# Patient Record
Sex: Female | Born: 1945 | Race: White | Hispanic: No | Marital: Married | Smoking: Never smoker
Health system: Southern US, Community
[De-identification: ages and names within clinical notes are randomized; demographics above are authoritative.]

## PROBLEM LIST (undated history)

## (undated) DIAGNOSIS — R011 Cardiac murmur, unspecified: Secondary | ICD-10-CM

## (undated) DIAGNOSIS — E079 Disorder of thyroid, unspecified: Secondary | ICD-10-CM

## (undated) DIAGNOSIS — M1990 Unspecified osteoarthritis, unspecified site: Secondary | ICD-10-CM

## (undated) DIAGNOSIS — K219 Gastro-esophageal reflux disease without esophagitis: Secondary | ICD-10-CM

## (undated) HISTORY — DX: Cardiac murmur, unspecified: R01.1

## (undated) HISTORY — PX: ABDOMINAL HYSTERECTOMY: SHX81

## (undated) HISTORY — DX: Disorder of thyroid, unspecified: E07.9

## (undated) HISTORY — DX: Gastro-esophageal reflux disease without esophagitis: K21.9

## (undated) HISTORY — DX: Unspecified osteoarthritis, unspecified site: M19.90

---

## 2014-03-30 DIAGNOSIS — M48061 Spinal stenosis, lumbar region without neurogenic claudication: Secondary | ICD-10-CM | POA: Insufficient documentation

## 2014-03-30 DIAGNOSIS — M7061 Trochanteric bursitis, right hip: Secondary | ICD-10-CM

## 2014-03-30 HISTORY — DX: Trochanteric bursitis, right hip: M70.61

## 2014-08-04 DIAGNOSIS — M533 Sacrococcygeal disorders, not elsewhere classified: Secondary | ICD-10-CM | POA: Insufficient documentation

## 2015-01-16 DIAGNOSIS — L812 Freckles: Secondary | ICD-10-CM | POA: Diagnosis not present

## 2015-01-16 DIAGNOSIS — R5383 Other fatigue: Secondary | ICD-10-CM | POA: Diagnosis not present

## 2015-01-16 DIAGNOSIS — E538 Deficiency of other specified B group vitamins: Secondary | ICD-10-CM | POA: Diagnosis not present

## 2015-01-16 DIAGNOSIS — D230 Other benign neoplasm of skin of lip: Secondary | ICD-10-CM | POA: Diagnosis not present

## 2015-01-16 DIAGNOSIS — D2322 Other benign neoplasm of skin of left ear and external auricular canal: Secondary | ICD-10-CM | POA: Diagnosis not present

## 2015-01-16 DIAGNOSIS — R7301 Impaired fasting glucose: Secondary | ICD-10-CM | POA: Diagnosis not present

## 2015-01-16 DIAGNOSIS — D225 Melanocytic nevi of trunk: Secondary | ICD-10-CM | POA: Diagnosis not present

## 2015-01-16 DIAGNOSIS — L218 Other seborrheic dermatitis: Secondary | ICD-10-CM | POA: Diagnosis not present

## 2015-01-16 DIAGNOSIS — R5381 Other malaise: Secondary | ICD-10-CM | POA: Diagnosis not present

## 2015-01-16 DIAGNOSIS — E012 Iodine-deficiency related (endemic) goiter, unspecified: Secondary | ICD-10-CM | POA: Diagnosis not present

## 2015-01-16 DIAGNOSIS — Z1283 Encounter for screening for malignant neoplasm of skin: Secondary | ICD-10-CM | POA: Diagnosis not present

## 2015-01-16 DIAGNOSIS — L570 Actinic keratosis: Secondary | ICD-10-CM | POA: Diagnosis not present

## 2015-01-16 DIAGNOSIS — E784 Other hyperlipidemia: Secondary | ICD-10-CM | POA: Diagnosis not present

## 2015-01-16 DIAGNOSIS — E559 Vitamin D deficiency, unspecified: Secondary | ICD-10-CM | POA: Diagnosis not present

## 2015-01-16 DIAGNOSIS — D1801 Hemangioma of skin and subcutaneous tissue: Secondary | ICD-10-CM | POA: Diagnosis not present

## 2015-01-16 DIAGNOSIS — L821 Other seborrheic keratosis: Secondary | ICD-10-CM | POA: Diagnosis not present

## 2015-01-20 DIAGNOSIS — E784 Other hyperlipidemia: Secondary | ICD-10-CM | POA: Diagnosis not present

## 2015-01-20 DIAGNOSIS — R7301 Impaired fasting glucose: Secondary | ICD-10-CM | POA: Diagnosis not present

## 2015-01-20 DIAGNOSIS — E559 Vitamin D deficiency, unspecified: Secondary | ICD-10-CM | POA: Diagnosis not present

## 2015-01-20 DIAGNOSIS — E012 Iodine-deficiency related (endemic) goiter, unspecified: Secondary | ICD-10-CM | POA: Diagnosis not present

## 2015-05-02 DIAGNOSIS — E559 Vitamin D deficiency, unspecified: Secondary | ICD-10-CM | POA: Diagnosis not present

## 2015-05-02 DIAGNOSIS — E538 Deficiency of other specified B group vitamins: Secondary | ICD-10-CM | POA: Diagnosis not present

## 2015-05-02 DIAGNOSIS — R5383 Other fatigue: Secondary | ICD-10-CM | POA: Diagnosis not present

## 2015-05-02 DIAGNOSIS — R7301 Impaired fasting glucose: Secondary | ICD-10-CM | POA: Diagnosis not present

## 2015-05-02 DIAGNOSIS — E784 Other hyperlipidemia: Secondary | ICD-10-CM | POA: Diagnosis not present

## 2015-05-02 DIAGNOSIS — T7840XA Allergy, unspecified, initial encounter: Secondary | ICD-10-CM | POA: Diagnosis not present

## 2015-05-02 DIAGNOSIS — E012 Iodine-deficiency related (endemic) goiter, unspecified: Secondary | ICD-10-CM | POA: Diagnosis not present

## 2015-05-02 DIAGNOSIS — R310 Gross hematuria: Secondary | ICD-10-CM | POA: Diagnosis not present

## 2015-05-02 DIAGNOSIS — N952 Postmenopausal atrophic vaginitis: Secondary | ICD-10-CM | POA: Diagnosis not present

## 2015-05-02 DIAGNOSIS — F430 Acute stress reaction: Secondary | ICD-10-CM | POA: Diagnosis not present

## 2015-05-02 DIAGNOSIS — R5381 Other malaise: Secondary | ICD-10-CM | POA: Diagnosis not present

## 2015-05-04 DIAGNOSIS — F430 Acute stress reaction: Secondary | ICD-10-CM | POA: Diagnosis not present

## 2015-05-04 DIAGNOSIS — E784 Other hyperlipidemia: Secondary | ICD-10-CM | POA: Diagnosis not present

## 2015-05-04 DIAGNOSIS — E039 Hypothyroidism, unspecified: Secondary | ICD-10-CM | POA: Diagnosis not present

## 2015-05-04 DIAGNOSIS — E012 Iodine-deficiency related (endemic) goiter, unspecified: Secondary | ICD-10-CM | POA: Diagnosis not present

## 2015-05-17 DIAGNOSIS — M65341 Trigger finger, right ring finger: Secondary | ICD-10-CM | POA: Diagnosis not present

## 2015-06-07 DIAGNOSIS — N951 Menopausal and female climacteric states: Secondary | ICD-10-CM | POA: Diagnosis not present

## 2015-07-06 DIAGNOSIS — E039 Hypothyroidism, unspecified: Secondary | ICD-10-CM | POA: Diagnosis not present

## 2015-07-06 DIAGNOSIS — T7840XA Allergy, unspecified, initial encounter: Secondary | ICD-10-CM | POA: Diagnosis not present

## 2015-07-06 DIAGNOSIS — E012 Iodine-deficiency related (endemic) goiter, unspecified: Secondary | ICD-10-CM | POA: Diagnosis not present

## 2015-07-06 DIAGNOSIS — E559 Vitamin D deficiency, unspecified: Secondary | ICD-10-CM | POA: Diagnosis not present

## 2015-08-09 DIAGNOSIS — M6530 Trigger finger, unspecified finger: Secondary | ICD-10-CM | POA: Diagnosis not present

## 2015-08-09 DIAGNOSIS — Z01818 Encounter for other preprocedural examination: Secondary | ICD-10-CM | POA: Diagnosis not present

## 2015-08-09 DIAGNOSIS — M65341 Trigger finger, right ring finger: Secondary | ICD-10-CM | POA: Diagnosis not present

## 2015-08-17 DIAGNOSIS — E079 Disorder of thyroid, unspecified: Secondary | ICD-10-CM | POA: Diagnosis not present

## 2015-08-17 DIAGNOSIS — M65341 Trigger finger, right ring finger: Secondary | ICD-10-CM | POA: Diagnosis not present

## 2015-12-07 DIAGNOSIS — H6121 Impacted cerumen, right ear: Secondary | ICD-10-CM | POA: Diagnosis not present

## 2015-12-07 DIAGNOSIS — H6061 Unspecified chronic otitis externa, right ear: Secondary | ICD-10-CM | POA: Diagnosis not present

## 2015-12-07 DIAGNOSIS — H903 Sensorineural hearing loss, bilateral: Secondary | ICD-10-CM | POA: Diagnosis not present

## 2016-02-14 DIAGNOSIS — E559 Vitamin D deficiency, unspecified: Secondary | ICD-10-CM | POA: Diagnosis not present

## 2016-02-14 DIAGNOSIS — E784 Other hyperlipidemia: Secondary | ICD-10-CM | POA: Diagnosis not present

## 2016-02-14 DIAGNOSIS — N952 Postmenopausal atrophic vaginitis: Secondary | ICD-10-CM | POA: Diagnosis not present

## 2016-02-14 DIAGNOSIS — E012 Iodine-deficiency related (endemic) goiter, unspecified: Secondary | ICD-10-CM | POA: Diagnosis not present

## 2016-02-14 DIAGNOSIS — R7301 Impaired fasting glucose: Secondary | ICD-10-CM | POA: Diagnosis not present

## 2016-02-26 DIAGNOSIS — N950 Postmenopausal bleeding: Secondary | ICD-10-CM | POA: Diagnosis not present

## 2016-02-26 DIAGNOSIS — E784 Other hyperlipidemia: Secondary | ICD-10-CM | POA: Diagnosis not present

## 2016-02-26 DIAGNOSIS — E012 Iodine-deficiency related (endemic) goiter, unspecified: Secondary | ICD-10-CM | POA: Diagnosis not present

## 2016-02-26 DIAGNOSIS — Z0001 Encounter for general adult medical examination with abnormal findings: Secondary | ICD-10-CM | POA: Diagnosis not present

## 2016-02-26 DIAGNOSIS — G4700 Insomnia, unspecified: Secondary | ICD-10-CM | POA: Diagnosis not present

## 2016-02-27 DIAGNOSIS — M65332 Trigger finger, left middle finger: Secondary | ICD-10-CM | POA: Diagnosis not present

## 2016-03-04 DIAGNOSIS — Z1231 Encounter for screening mammogram for malignant neoplasm of breast: Secondary | ICD-10-CM | POA: Diagnosis not present

## 2016-03-25 DIAGNOSIS — M79671 Pain in right foot: Secondary | ICD-10-CM | POA: Diagnosis not present

## 2016-08-05 DIAGNOSIS — M1812 Unilateral primary osteoarthritis of first carpometacarpal joint, left hand: Secondary | ICD-10-CM | POA: Diagnosis not present

## 2016-08-05 DIAGNOSIS — M65332 Trigger finger, left middle finger: Secondary | ICD-10-CM | POA: Diagnosis not present

## 2016-10-01 DIAGNOSIS — E559 Vitamin D deficiency, unspecified: Secondary | ICD-10-CM | POA: Diagnosis not present

## 2016-10-01 DIAGNOSIS — Z780 Asymptomatic menopausal state: Secondary | ICD-10-CM | POA: Diagnosis not present

## 2016-10-01 DIAGNOSIS — E039 Hypothyroidism, unspecified: Secondary | ICD-10-CM | POA: Diagnosis not present

## 2016-10-01 DIAGNOSIS — E7800 Pure hypercholesterolemia, unspecified: Secondary | ICD-10-CM | POA: Diagnosis not present

## 2016-10-01 DIAGNOSIS — E618 Deficiency of other specified nutrient elements: Secondary | ICD-10-CM | POA: Diagnosis not present

## 2016-11-05 DIAGNOSIS — L814 Other melanin hyperpigmentation: Secondary | ICD-10-CM | POA: Diagnosis not present

## 2016-11-05 DIAGNOSIS — L821 Other seborrheic keratosis: Secondary | ICD-10-CM | POA: Diagnosis not present

## 2016-11-05 DIAGNOSIS — D225 Melanocytic nevi of trunk: Secondary | ICD-10-CM | POA: Diagnosis not present

## 2016-11-05 DIAGNOSIS — L739 Follicular disorder, unspecified: Secondary | ICD-10-CM | POA: Diagnosis not present

## 2016-11-05 DIAGNOSIS — L820 Inflamed seborrheic keratosis: Secondary | ICD-10-CM | POA: Diagnosis not present

## 2017-01-07 DIAGNOSIS — J209 Acute bronchitis, unspecified: Secondary | ICD-10-CM | POA: Diagnosis not present

## 2017-01-20 DIAGNOSIS — Z23 Encounter for immunization: Secondary | ICD-10-CM | POA: Diagnosis not present

## 2017-02-11 DIAGNOSIS — E7800 Pure hypercholesterolemia, unspecified: Secondary | ICD-10-CM | POA: Diagnosis not present

## 2017-02-11 DIAGNOSIS — Z130 Encounter for screening for diseases of the blood and blood-forming organs and certain disorders involving the immune mechanism: Secondary | ICD-10-CM | POA: Diagnosis not present

## 2017-02-11 DIAGNOSIS — E039 Hypothyroidism, unspecified: Secondary | ICD-10-CM | POA: Diagnosis not present

## 2017-02-11 DIAGNOSIS — E618 Deficiency of other specified nutrient elements: Secondary | ICD-10-CM | POA: Diagnosis not present

## 2017-02-11 DIAGNOSIS — E559 Vitamin D deficiency, unspecified: Secondary | ICD-10-CM | POA: Diagnosis not present

## 2017-02-11 DIAGNOSIS — Z780 Asymptomatic menopausal state: Secondary | ICD-10-CM | POA: Diagnosis not present

## 2017-03-17 DIAGNOSIS — M8588 Other specified disorders of bone density and structure, other site: Secondary | ICD-10-CM | POA: Diagnosis not present

## 2017-03-17 DIAGNOSIS — Z124 Encounter for screening for malignant neoplasm of cervix: Secondary | ICD-10-CM | POA: Diagnosis not present

## 2017-03-17 DIAGNOSIS — Z1231 Encounter for screening mammogram for malignant neoplasm of breast: Secondary | ICD-10-CM | POA: Diagnosis not present

## 2017-03-17 DIAGNOSIS — Z6825 Body mass index (BMI) 25.0-25.9, adult: Secondary | ICD-10-CM | POA: Diagnosis not present

## 2017-03-17 DIAGNOSIS — N958 Other specified menopausal and perimenopausal disorders: Secondary | ICD-10-CM | POA: Diagnosis not present

## 2017-04-01 DIAGNOSIS — E618 Deficiency of other specified nutrient elements: Secondary | ICD-10-CM | POA: Diagnosis not present

## 2017-04-01 DIAGNOSIS — Z780 Asymptomatic menopausal state: Secondary | ICD-10-CM | POA: Diagnosis not present

## 2017-04-01 DIAGNOSIS — E559 Vitamin D deficiency, unspecified: Secondary | ICD-10-CM | POA: Diagnosis not present

## 2017-04-01 DIAGNOSIS — E7800 Pure hypercholesterolemia, unspecified: Secondary | ICD-10-CM | POA: Diagnosis not present

## 2017-04-01 DIAGNOSIS — E039 Hypothyroidism, unspecified: Secondary | ICD-10-CM | POA: Diagnosis not present

## 2017-07-22 DIAGNOSIS — E039 Hypothyroidism, unspecified: Secondary | ICD-10-CM | POA: Diagnosis not present

## 2017-07-22 DIAGNOSIS — E042 Nontoxic multinodular goiter: Secondary | ICD-10-CM | POA: Diagnosis not present

## 2017-07-22 DIAGNOSIS — E063 Autoimmune thyroiditis: Secondary | ICD-10-CM | POA: Diagnosis not present

## 2017-11-18 DIAGNOSIS — B002 Herpesviral gingivostomatitis and pharyngotonsillitis: Secondary | ICD-10-CM | POA: Diagnosis not present

## 2017-11-18 DIAGNOSIS — B029 Zoster without complications: Secondary | ICD-10-CM | POA: Diagnosis not present

## 2017-12-25 DIAGNOSIS — D1801 Hemangioma of skin and subcutaneous tissue: Secondary | ICD-10-CM | POA: Diagnosis not present

## 2017-12-25 DIAGNOSIS — D225 Melanocytic nevi of trunk: Secondary | ICD-10-CM | POA: Diagnosis not present

## 2017-12-25 DIAGNOSIS — L812 Freckles: Secondary | ICD-10-CM | POA: Diagnosis not present

## 2017-12-25 DIAGNOSIS — Z1389 Encounter for screening for other disorder: Secondary | ICD-10-CM | POA: Diagnosis not present

## 2017-12-25 DIAGNOSIS — L821 Other seborrheic keratosis: Secondary | ICD-10-CM | POA: Diagnosis not present

## 2017-12-25 DIAGNOSIS — L570 Actinic keratosis: Secondary | ICD-10-CM | POA: Diagnosis not present

## 2019-10-20 ENCOUNTER — Encounter (HOSPITAL_COMMUNITY): Payer: Self-pay

## 2019-10-20 ENCOUNTER — Emergency Department (HOSPITAL_BASED_OUTPATIENT_CLINIC_OR_DEPARTMENT_OTHER)
Admission: EM | Admit: 2019-10-20 | Discharge: 2019-10-21 | Disposition: A | Discharge status: Home / Self Care | Payer: Medicare Other | Attending: Emergency Medicine | Admitting: Emergency Medicine

## 2019-10-20 ENCOUNTER — Emergency Department (HOSPITAL_BASED_OUTPATIENT_CLINIC_OR_DEPARTMENT_OTHER): Payer: Medicare Other

## 2019-10-20 ENCOUNTER — Ambulatory Visit (INDEPENDENT_AMBULATORY_CARE_PROVIDER_SITE_OTHER)
Admission: EM | Admit: 2019-10-20 | Discharge: 2019-10-20 | Disposition: A | Discharge status: Home / Self Care | Payer: Medicare Other | Source: Home / Self Care | Attending: Family Medicine | Admitting: Family Medicine

## 2019-10-20 ENCOUNTER — Other Ambulatory Visit: Payer: Self-pay

## 2019-10-20 ENCOUNTER — Encounter (HOSPITAL_BASED_OUTPATIENT_CLINIC_OR_DEPARTMENT_OTHER): Payer: Self-pay

## 2019-10-20 DIAGNOSIS — R112 Nausea with vomiting, unspecified: Secondary | ICD-10-CM | POA: Insufficient documentation

## 2019-10-20 DIAGNOSIS — Z20828 Contact with and (suspected) exposure to other viral communicable diseases: Secondary | ICD-10-CM | POA: Insufficient documentation

## 2019-10-20 DIAGNOSIS — R030 Elevated blood-pressure reading, without diagnosis of hypertension: Secondary | ICD-10-CM

## 2019-10-20 DIAGNOSIS — R519 Headache, unspecified: Secondary | ICD-10-CM | POA: Insufficient documentation

## 2019-10-20 DIAGNOSIS — N289 Disorder of kidney and ureter, unspecified: Secondary | ICD-10-CM

## 2019-10-20 DIAGNOSIS — R799 Abnormal finding of blood chemistry, unspecified: Secondary | ICD-10-CM | POA: Diagnosis present

## 2019-10-20 DIAGNOSIS — Z79899 Other long term (current) drug therapy: Secondary | ICD-10-CM | POA: Insufficient documentation

## 2019-10-20 DIAGNOSIS — M546 Pain in thoracic spine: Secondary | ICD-10-CM

## 2019-10-20 LAB — CBC WITH DIFFERENTIAL/PLATELET
Abs Immature Granulocytes: 0.03 10*3/uL (ref 0.00–0.07)
Abs Immature Granulocytes: 0.04 10*3/uL (ref 0.00–0.07)
Basophils Absolute: 0 10*3/uL (ref 0.0–0.1)
Basophils Absolute: 0 10*3/uL (ref 0.0–0.1)
Basophils Relative: 0 %
Basophils Relative: 0 %
Eosinophils Absolute: 0 10*3/uL (ref 0.0–0.5)
Eosinophils Absolute: 0 10*3/uL (ref 0.0–0.5)
Eosinophils Relative: 0 %
Eosinophils Relative: 0 %
HCT: 42.5 % (ref 36.0–46.0)
HCT: 41.9 % (ref 36.0–46.0)
Hemoglobin: 14.5 g/dL (ref 12.0–15.0)
Hemoglobin: 14.1 g/dL (ref 12.0–15.0)
Immature Granulocytes: 0 %
Immature Granulocytes: 0 %
Lymphocytes Relative: 13 %
Lymphocytes Relative: 13 %
Lymphs Abs: 1.5 10*3/uL (ref 0.7–4.0)
Lymphs Abs: 1.5 10*3/uL (ref 0.7–4.0)
MCH: 32.2 pg (ref 26.0–34.0)
MCH: 32.5 pg (ref 26.0–34.0)
MCHC: 34.1 g/dL (ref 30.0–36.0)
MCHC: 33.7 g/dL (ref 30.0–36.0)
MCV: 94.4 fL (ref 80.0–100.0)
MCV: 96.5 fL (ref 80.0–100.0)
Monocytes Absolute: 1 10*3/uL (ref 0.1–1.0)
Monocytes Absolute: 1.1 10*3/uL — ABNORMAL HIGH (ref 0.1–1.0)
Monocytes Relative: 8 %
Monocytes Relative: 9 %
Neutro Abs: 9.1 10*3/uL — ABNORMAL HIGH (ref 1.7–7.7)
Neutro Abs: 9.2 10*3/uL — ABNORMAL HIGH (ref 1.7–7.7)
Neutrophils Relative %: 79 %
Neutrophils Relative %: 78 %
Platelets: 261 10*3/uL (ref 150–400)
Platelets: 245 10*3/uL (ref 150–400)
RBC: 4.5 MIL/uL (ref 3.87–5.11)
RBC: 4.34 MIL/uL (ref 3.87–5.11)
RDW: 12 % (ref 11.5–15.5)
RDW: 12.3 % (ref 11.5–15.5)
WBC: 11.7 10*3/uL — ABNORMAL HIGH (ref 4.0–10.5)
WBC: 11.9 10*3/uL — ABNORMAL HIGH (ref 4.0–10.5)
nRBC: 0 % (ref 0.0–0.2)
nRBC: 0 % (ref 0.0–0.2)

## 2019-10-20 LAB — BASIC METABOLIC PANEL
Anion gap: 9 (ref 5–15)
Anion gap: 11 (ref 5–15)
BUN: 29 mg/dL — ABNORMAL HIGH (ref 8–23)
BUN: 34 mg/dL — ABNORMAL HIGH (ref 8–23)
CO2: 23 mmol/L (ref 22–32)
CO2: 24 mmol/L (ref 22–32)
Calcium: 9.6 mg/dL (ref 8.9–10.3)
Calcium: 9.4 mg/dL (ref 8.9–10.3)
Chloride: 106 mmol/L (ref 98–111)
Chloride: 102 mmol/L (ref 98–111)
Creatinine, Ser: 3.17 mg/dL — ABNORMAL HIGH (ref 0.44–1.00)
Creatinine, Ser: 3.33 mg/dL — ABNORMAL HIGH (ref 0.44–1.00)
GFR calc Af Amer: 16 mL/min — ABNORMAL LOW (ref >60)
GFR calc Af Amer: 15 mL/min — ABNORMAL LOW (ref >60)
GFR calc non Af Amer: 14 mL/min — ABNORMAL LOW (ref >60)
GFR calc non Af Amer: 13 mL/min — ABNORMAL LOW (ref >60)
Glucose, Bld: 117 mg/dL — ABNORMAL HIGH (ref 70–99)
Glucose, Bld: 133 mg/dL — ABNORMAL HIGH (ref 70–99)
Potassium: 5 mmol/L (ref 3.5–5.1)
Potassium: 3.9 mmol/L (ref 3.5–5.1)
Sodium: 138 mmol/L (ref 135–145)
Sodium: 137 mmol/L (ref 135–145)

## 2019-10-20 LAB — POCT URINALYSIS DIP (DEVICE)
Bilirubin Urine: NEGATIVE
Glucose, UA: NEGATIVE mg/dL
Ketones, ur: NEGATIVE mg/dL
Nitrite: NEGATIVE
Protein, ur: 100 mg/dL — AB
Specific Gravity, Urine: 1.01 (ref 1.005–1.030)
Urobilinogen, UA: 0.2 mg/dL (ref 0.0–1.0)
pH: 5.5 (ref 5.0–8.0)

## 2019-10-20 MED ORDER — ONDANSETRON HCL 4 MG/2ML IJ SOLN
4.0000 mg | Freq: Once | INTRAMUSCULAR | Status: AC
  Administered 2019-10-20: 4 mg via INTRAVENOUS
  Filled 2019-10-20: qty 2

## 2019-10-20 MED ORDER — SODIUM CHLORIDE 0.9 % IV BOLUS
1000.0000 mL | Freq: Once | INTRAVENOUS | Status: AC
  Administered 2019-10-20: 1000 mL via INTRAVENOUS

## 2019-10-20 MED ORDER — ONDANSETRON 4 MG PO TBDP
ORAL_TABLET | ORAL | Status: AC
  Filled 2019-10-20: qty 1

## 2019-10-20 MED ORDER — ONDANSETRON 4 MG PO TBDP
4.0000 mg | ORAL_TABLET | Freq: Once | ORAL | Status: AC
  Administered 2019-10-20: 4 mg via ORAL

## 2019-10-20 NOTE — ED Provider Notes (Addendum)
[facility]    CSN: [id number] Arrival date & time: 10/20/19  1807      History   Chief Complaint Chief Complaint  Patient presents with  . Back Pain  . Emesis    HPI Gwendolyn Perez is a 73 y.o. female.   Nusrat Line presents with complaints of nausea. States she woke this morning at around midnight with thoracic back pain and left of back pain, she stretched it, took two tylenol and applied voltaren gel, which helped. While stretching she noted nausea. She then woke and vomited at 2, 4, and 5am today. Small amount of emesis each episode no blood or black. Has had 4-5 bm's today, not watery or loose but this is more than usual for her. She still "feels miserable." still with nausea. Some headache. Back pain has improved. No abdominal pain in particular. No urinary symptoms or blood in urine. No blood or black in stool. No specific URI symptoms. No known ill contacts. She works as a Research scientist (physical sciences) at [employer], however. States she had eaten a lot of romaine lettuce last night, no other known concerning food intake. No significant past medical history, she is otherwise healthy. No history of kidney dysfunction or htn. Has not had any fevers.     ROS per HPI, negative if not otherwise mentioned.      History reviewed. No pertinent past medical history.  There are no active problems to display for this patient.   History reviewed. No pertinent surgical history.  OB History   No obstetric history on file.      Home Medications    Prior to Admission medications   Medication Sig Start Date End Date Taking? Authorizing Provider  terbinafine (LAMISIL) 250 MG tablet Take 250 mg by mouth daily. 10/05/19  Yes [provider]  traZODone (DESYREL) 50 MG tablet Take 50 mg by mouth at bedtime. 09/27/19  Yes [provider]    Family History Family History  Problem Relation Age of Onset  . Healthy Mother   . Cancer Father     Social History Social  History   Tobacco Use  . Smoking status: Never Smoker  . Smokeless tobacco: Never Used  Substance Use Topics  . Alcohol use: Yes    Alcohol/week: 12.0 standard drinks    Types: 12 Glasses of wine per week  . Drug use: Not on file     Allergies   Patient has no known allergies.   Review of Systems Review of Systems   Physical Exam Triage Vital Signs ED Triage Vitals  Enc Vitals Group     BP 10/20/19 1828 (!) 140/104     Pulse Rate 10/20/19 1828 79     Resp 10/20/19 1828 16     Temp 10/20/19 1828 98.3 F (36.8 C)     Temp Source 10/20/19 1828 Oral     SpO2 10/20/19 1828 100 %     Weight --      Height --      Head Circumference --      Peak Flow --      Pain Score 10/20/19 1824 4     Pain Loc --      Pain Edu? --      Excl. in [place]? --    No data found.  Updated Vital Signs BP (!) 150/91 (BP Location: Right Arm)   Pulse 79   Temp 98.3 F (36.8 C) (Oral)   Resp 16   SpO2 100%  Physical Exam Constitutional:      General: She is not in acute distress.    Appearance: She is well-developed.  Cardiovascular:     Rate and Rhythm: Normal rate and regular rhythm.     Heart sounds: Normal heart sounds.  Pulmonary:     Effort: Pulmonary effort is normal.     Breath sounds: Normal breath sounds.  Abdominal:     Tenderness: There is abdominal tenderness in the left lower quadrant. There is no right CVA tenderness or left CVA tenderness.     Comments: Mild llq pain on palpation   Musculoskeletal:     Comments: Mild proximal thoracic tenderness, patient states history of arthritis and this is not necessarily new for her.   Skin:    General: Skin is warm and dry.  Neurological:     Mental Status: She is alert and oriented to person, place, and time.    EKG:  NSR rate of 80 . Previous EKG was not available for review. No stwave changes as interpreted by me.    UC Treatments / Results  Labs (all labs ordered are listed, but only abnormal results are displayed)  Labs Reviewed  CBC WITH DIFFERENTIAL/PLATELET - Abnormal; Notable for the following components:      Result Value   WBC 11.7 (*)    Neutro Abs 9.1 (*)    All other components within normal limits  BASIC METABOLIC PANEL - Abnormal; Notable for the following components:   Glucose, Bld 117 (*)    BUN 29 (*)    Creatinine, Ser 3.17 (*)    GFR calc non Af Amer 14 (*)    GFR calc Af Amer 16 (*)    All other components within normal limits  POCT URINALYSIS DIP (DEVICE) - Abnormal; Notable for the following components:   Hgb urine dipstick SMALL (*)    Protein, ur 100 (*)    Leukocytes,Ua TRACE (*)    All other components within normal limits  SARS CORONAVIRUS 2 (TAT 6-24 HRS)  URINE CULTURE    EKG   Radiology No results found.  Procedures Procedures (including critical care time)  Medications Ordered in UC Medications  ondansetron (ZOFRAN-ODT) disintegrating tablet 4 mg (4 mg Oral Given 10/20/19 1841)  ondansetron (ZOFRAN-ODT) 4 MG disintegrating tablet (has no administration in time range)    Initial Impression / Assessment and Plan / UC Course  I have reviewed the triage vital signs and the nursing notes.  Pertinent labs & imaging results that were available during my care of the patient were reviewed by me and considered in my medical decision making (see chart for details).     Patient is tolerating oral sips in clinic now. Back pain has improved. Hypertensive here today. No fever. No tachycardia. Non specific physical exam at this time. No emesis in approximately 12 hours. BMP presents with elevated creatinine of 3.1 which is new for patient, per chart review in care everywhere, and to patient's knowledge. WBC and neutrophils also elevated. Acute renal insuffiency, I do not feel I have suffiecient evidence for definitive diagnosis here in uc. Kidney stone vs gastroenteritis with dehydration vs diverticulitis vs covid-19 all considered. Recommended further evaluation and  management in the ER at this time. Patient verbalized understanding and agreeable to plan.  Safe for self transport.  Final Clinical Impressions(s) / UC Diagnoses   Final diagnoses:  Acute renal insufficiency  Acute left-sided thoracic back pain  Non-intractable vomiting with nausea, unspecified vomiting type  Discharge Instructions     I do recommend further evaluation in the emergency department as it does appear that your kidneys are acutely insufficient.     ED Prescriptions    None     PDMP not reviewed this encounter.   Zigmund Gottron, NP 10/20/19 1958    Zigmund Gottron, NP 10/20/19 2007

## 2019-10-20 NOTE — Discharge Instructions (Addendum)
I do recommend further evaluation in the emergency department as it does appear that your kidneys are acutely insufficient.

## 2019-10-20 NOTE — ED Triage Notes (Signed)
Patient presents to Urgent Care with complaints of back pain followed by three episodes of vomiting hours later since earlier today. Patient reports she has continued to feel nauseous but has not vomited in the past 2 hours.

## 2019-10-20 NOTE — ED Provider Notes (Signed)
[facility] EMERGENCY DEPARTMENT Provider Note   CSN: [id number] Arrival date & time: 10/20/19  2043     History   Chief Complaint Chief Complaint  Patient presents with   Abnormal Lab    HPI Gwendolyn Perez is a 74 y.o. female.     The history is provided by the patient.  Emesis Severity:  Mild Duration:  1 day Timing:  Rare Number of daily episodes:  1 Quality:  Stomach contents Able to tolerate:  Liquids and solids Progression:  Resolved Chronicity:  New Recent urination:  Normal Context: not post-tussive   Relieved by:  Nothing Worsened by:  Nothing Ineffective treatments:  None tried Associated symptoms: no abdominal pain, no arthralgias, no chills, no cough, no diarrhea, no fever, no headaches, no myalgias, no sore throat and no URI   Risk factors: no alcohol use   Patient with no significant PMH was seen at urgent care for one episode of emesis and stooling x 4 in one day but it was not diarrhea.  She had some back pain with this.  No f/c/r.  She is not having dysuria nor hematuria.  No f/c/r. No covid contacts nor symptoms.  She was found to have an elevated creatinine and was referred in for further work up of same.  Given zofran.  No further emesis or stooling.    History reviewed. No pertinent past medical history.  There are no active problems to display for this patient.   History reviewed. No pertinent surgical history.   OB History   No obstetric history on file.      Home Medications    Prior to Admission medications   Medication Sig Start Date End Date Taking? Authorizing Provider  terbinafine (LAMISIL) 250 MG tablet Take 250 mg by mouth daily. 10/05/19   [provider]  traZODone (DESYREL) 50 MG tablet Take 50 mg by mouth at bedtime. 09/27/19   [provider]    Family History Family History  Problem Relation Age of Onset   Healthy Mother    Cancer Father     Social History Social History   Tobacco  Use   Smoking status: Never Smoker   Smokeless tobacco: Never Used  Substance Use Topics   Alcohol use: Yes    Alcohol/week: 12.0 standard drinks    Types: 12 Glasses of wine per week   Drug use: Not on file     Allergies   Patient has no known allergies.   Review of Systems Review of Systems  Constitutional: Negative for chills and fever.  HENT: Negative for sore throat.   Respiratory: Negative for cough and shortness of breath.   Cardiovascular: Negative for chest pain.  Gastrointestinal: Positive for nausea and vomiting. Negative for abdominal pain and diarrhea.  Genitourinary: Negative for difficulty urinating and genital sores.  Musculoskeletal: Negative for arthralgias and myalgias.  Skin: Negative for color change.  Neurological: Negative for headaches.  Psychiatric/Behavioral: Negative for agitation.  All other systems reviewed and are negative.    Physical Exam Updated Vital Signs BP (!) 141/85 (BP Location: Left Arm)    Pulse 85    Temp 98.8 F (37.1 C) (Oral)    Resp 18    Ht 5\' 3"  (1.6 m)    Wt 65.8 kg    SpO2 96%    BMI 25.69 kg/m   Physical Exam Vitals signs and nursing note reviewed.  Constitutional:      General: She is not in acute distress.  Appearance: Normal appearance.  HENT:     Head: Normocephalic and atraumatic.     Nose: Nose normal.  Eyes:     Conjunctiva/sclera: Conjunctivae normal.     Pupils: Pupils are equal, round, and reactive to light.  Neck:     Musculoskeletal: Normal range of motion and neck supple.  Cardiovascular:     Rate and Rhythm: Normal rate and regular rhythm.     Pulses: Normal pulses.     Heart sounds: Normal heart sounds.  Pulmonary:     Effort: Pulmonary effort is normal.     Breath sounds: Normal breath sounds.  Abdominal:     General: Abdomen is flat. Bowel sounds are normal.     Tenderness: There is no abdominal tenderness. There is no guarding or rebound.  Musculoskeletal: Normal range of motion.    Skin:    General: Skin is warm and dry.     Capillary Refill: Capillary refill takes less than 2 seconds.  Neurological:     General: No focal deficit present.     Mental Status: She is alert and oriented to person, place, and time.     Deep Tendon Reflexes: Reflexes normal.  Psychiatric:        Mood and Affect: Mood normal.        Behavior: Behavior normal.      ED Treatments / Results  Labs (all labs ordered are listed, but only abnormal results are displayed) Results for orders placed or performed during the hospital encounter of [id number]  Basic metabolic panel  Result Value Ref Range   Sodium 137 135 - 145 mmol/L   Potassium 3.9 3.5 - 5.1 mmol/L   Chloride 102 98 - 111 mmol/L   CO2 24 22 - 32 mmol/L   Glucose, Bld 133 (H) 70 - 99 mg/dL   BUN 34 (H) 8 - 23 mg/dL   Creatinine, Ser 3.33 (H) 0.44 - 1.00 mg/dL   Calcium 9.4 8.9 - 10.3 mg/dL   GFR calc non Af Amer 13 (L) >60 mL/min   GFR calc Af Amer 15 (L) >60 mL/min   Anion gap 11 5 - 15  CBC with Differential  Result Value Ref Range   WBC 11.9 (H) 4.0 - 10.5 K/uL   RBC 4.34 3.87 - 5.11 MIL/uL   Hemoglobin 14.1 12.0 - 15.0 g/dL   HCT 41.9 36.0 - 46.0 %   MCV 96.5 80.0 - 100.0 fL   MCH 32.5 26.0 - 34.0 pg   MCHC 33.7 30.0 - 36.0 g/dL   RDW 12.3 11.5 - 15.5 %   Platelets 245 150 - 400 K/uL   nRBC 0.0 0.0 - 0.2 %   Neutrophils Relative % 78 %   Neutro Abs 9.2 (H) 1.7 - 7.7 K/uL   Lymphocytes Relative 13 %   Lymphs Abs 1.5 0.7 - 4.0 K/uL   Monocytes Relative 9 %   Monocytes Absolute 1.1 (H) 0.1 - 1.0 K/uL   Eosinophils Relative 0 %   Eosinophils Absolute 0.0 0.0 - 0.5 K/uL   Basophils Relative 0 %   Basophils Absolute 0.0 0.0 - 0.1 K/uL   Immature Granulocytes 0 %   Abs Immature Granulocytes 0.04 0.00 - 0.07 K/uL  Urinalysis, Routine w reflex microscopic  Result Value Ref Range   Color, Urine YELLOW YELLOW   APPearance CLEAR CLEAR   Specific Gravity, Urine <1.005 (L) 1.005 - 1.030   pH 6.0 5.0 - 8.0    Glucose, UA NEGATIVE NEGATIVE mg/dL  Hgb urine dipstick SMALL (A) NEGATIVE   Bilirubin Urine NEGATIVE NEGATIVE   Ketones, ur NEGATIVE NEGATIVE mg/dL   Protein, ur 30 (A) NEGATIVE mg/dL   Nitrite NEGATIVE NEGATIVE   Leukocytes,Ua SMALL (A) NEGATIVE  Urinalysis, Microscopic (reflex)  Result Value Ref Range   RBC / HPF 6-10 0 - 5 RBC/hpf   WBC, UA 6-10 0 - 5 WBC/hpf   Bacteria, UA RARE (A) NONE SEEN   Squamous Epithelial / LPF 0-5 0 - 5   Ct Renal Stone Study  Result Date: 10/20/2019 CLINICAL DATA:  Abdominal pain and vomiting EXAM: CT ABDOMEN AND PELVIS WITHOUT CONTRAST TECHNIQUE: Multidetector CT imaging of the abdomen and pelvis was performed following the standard protocol without IV contrast. COMPARISON:  None. FINDINGS: Lower chest: No acute abnormality. Hepatobiliary: No focal liver abnormality is seen. No gallstones, gallbladder wall thickening, or biliary dilatation. Pancreas: Unremarkable. No pancreatic ductal dilatation or surrounding inflammatory changes. Spleen: Multiple splenic granulomas are seen. Adrenals/Urinary Tract: Adrenal glands are within normal limits. The right kidney demonstrates no renal calculi or obstructive changes. The ureter is within normal limits. The bladder is decompressed. The left kidney demonstrates mild perinephric stranding. No renal calculi or obstructive changes are noted. Stomach/Bowel: Scattered diverticular change of the colon is noted without evidence of diverticulitis. No obstructive changes of the colon are seen. The appendix is within normal limits. No small bowel or gastric abnormality is seen. Vascular/Lymphatic: Aortic atherosclerosis. No enlarged abdominal or pelvic lymph nodes. Reproductive: Uterus and bilateral adnexa are unremarkable. Other: No abdominal wall hernia or abnormality. No abdominopelvic ascites. Musculoskeletal: Degenerative changes of the lumbar spine are noted. No acute bony abnormality is seen. IMPRESSION: Mild perinephric  stranding of the left kidney without obstructive change. This could be related to underlying UTI. Diverticulosis without diverticulitis. No other focal abnormality is noted. Electronically Signed   By: Inez Catalina M.D.   On: 10/20/2019 23:42    Radiology No results found.  Procedures Procedures (including critical care time)  Medications Ordered in ED Medications  sodium chloride 0.9 % bolus 1,000 mL (has no administration in time range)  ondansetron (ZOFRAN) injection 4 mg (has no administration in time range)     Initial Impression / Assessment and Plan / ED Course  Patient sent here due to elevation of creatinine.  Based on BUN/Cr ratio and only one episode of emesis this is not dehydration. It is not obstructive as there are no stones or bladder outlet obstruction.  I am unable to find an offending medication or cause of this on history that I can stop to improve the elevation of creatinine.  I have hydrated the patient copiously in the ED though I do not believe this will completely relieve the elevation.  I will start antibiotics given subtle stranding seen on ct scan. I am not sure based on urine and CT this will bring creatinine back to normal either.  I will refer patient back to her PMD for labs in 5 days.  She is to hydrate copiously with water at home.  No NSAIDS.  Take all antibiotics.  I have also referred the patient to [facility] for further work up. I have had a very long discussion with the patient and her husband regarding her work up and my concerns.  I do not believe this is all dehydration.  Nor do I think a mild infection, possibly based on CT nothing on urine, would cause this.  Patient and husband verbalize understanding and agree to  follow up.    Gwendolyn Perez was evaluated in Emergency Department on 10/21/2019 for the symptoms described in the history of present illness. She was evaluated in the context of the global COVID-19 pandemic, which necessitated consideration  that the patient might be at risk for infection with the SARS-CoV-2 virus that causes COVID-19. Institutional protocols and algorithms that pertain to the evaluation of patients at risk for COVID-19 are in a state of rapid change based on information released by regulatory bodies including the CDC and federal and state organizations. These policies and algorithms were followed during the patient's care in the ED.  Final Clinical Impressions(s) / ED Diagnoses    Return for intractable cough, coughing up blood,fevers >100.4 unrelieved by medication, shortness of breath, intractable vomiting, chest pain, shortness of breath, weakness,numbness, changes in speech, facial asymmetry,abdominal pain, passing out,Inability to tolerate liquids or food, cough, altered mental status or any concerns. No signs of systemic illness or infection. The patient is nontoxic-appearing on exam and vital signs are within normal limits.   I have reviewed the triage vital signs and the nursing notes. Pertinent labs &imaging results that were available during my care of the patient were reviewed by me and considered in my medical decision making (see chart for details).  After history, exam, and medical workup I feel the patient has been appropriately medically screened and is safe for discharge home. Pertinent diagnoses were discussed with the patient. Patient was given return precautions   Jakobee Brackins, MD 10/21/19 0200

## 2019-10-20 NOTE — ED Triage Notes (Signed)
Pt c/o left flank/mid back pain, n/v started last night-seen and sent by PCP states she was sent for "kidney malfunction"-pt states her n/v was treated with med by PCP and her n/v is better-pt NAD-steady gait

## 2019-10-21 ENCOUNTER — Encounter (HOSPITAL_BASED_OUTPATIENT_CLINIC_OR_DEPARTMENT_OTHER): Payer: Self-pay | Admitting: Emergency Medicine

## 2019-10-21 LAB — URINALYSIS, ROUTINE W REFLEX MICROSCOPIC
Bilirubin Urine: NEGATIVE
Glucose, UA: NEGATIVE mg/dL
Ketones, ur: NEGATIVE mg/dL
Nitrite: NEGATIVE
Protein, ur: 30 mg/dL — AB
Specific Gravity, Urine: <1.005 — ABNORMAL LOW (ref 1.005–1.030)
pH: 6 (ref 5.0–8.0)

## 2019-10-21 LAB — URINALYSIS, MICROSCOPIC (REFLEX)

## 2019-10-21 MED ORDER — SODIUM CHLORIDE 0.9 % IV BOLUS
500.0000 mL | Freq: Once | INTRAVENOUS | Status: AC
  Administered 2019-10-21: 500 mL via INTRAVENOUS

## 2019-10-21 MED ORDER — SULFAMETHOXAZOLE-TRIMETHOPRIM 800-160 MG PO TABS: 1.0000 | ORAL_TABLET | Freq: Two times a day (BID) | ORAL | 0 refills | Status: AC

## 2019-10-21 NOTE — Discharge Instructions (Addendum)
Drink copious water.  Take all antibiotics.  Follow up with your doctor and [facility]

## 2019-10-22 LAB — NOVEL CORONAVIRUS, NAA (HOSP ORDER, SEND-OUT TO REF LAB; TAT 18-24 HRS): SARS-CoV-2, NAA: NOT DETECTED

## 2019-10-22 LAB — URINE CULTURE

## 2020-05-24 ENCOUNTER — Ambulatory Visit: Payer: Medicare Other | Admitting: Family Medicine

## 2020-08-17 ENCOUNTER — Ambulatory Visit: Payer: Medicare Other | Admitting: Podiatry

## 2020-08-22 ENCOUNTER — Ambulatory Visit (INDEPENDENT_AMBULATORY_CARE_PROVIDER_SITE_OTHER): Payer: Medicare Other

## 2020-08-22 ENCOUNTER — Encounter: Payer: Self-pay | Admitting: Podiatry

## 2020-08-22 ENCOUNTER — Other Ambulatory Visit: Payer: Self-pay

## 2020-08-22 ENCOUNTER — Ambulatory Visit (INDEPENDENT_AMBULATORY_CARE_PROVIDER_SITE_OTHER): Payer: Medicare Other | Admitting: Podiatry

## 2020-08-22 DIAGNOSIS — M2041 Other hammer toe(s) (acquired), right foot: Secondary | ICD-10-CM | POA: Diagnosis not present

## 2020-08-22 DIAGNOSIS — M25559 Pain in unspecified hip: Secondary | ICD-10-CM | POA: Insufficient documentation

## 2020-08-22 DIAGNOSIS — M25649 Stiffness of unspecified hand, not elsewhere classified: Secondary | ICD-10-CM | POA: Insufficient documentation

## 2020-08-22 DIAGNOSIS — M6281 Muscle weakness (generalized): Secondary | ICD-10-CM | POA: Insufficient documentation

## 2020-08-22 DIAGNOSIS — L603 Nail dystrophy: Secondary | ICD-10-CM | POA: Diagnosis not present

## 2020-08-22 DIAGNOSIS — M25549 Pain in joints of unspecified hand: Secondary | ICD-10-CM | POA: Insufficient documentation

## 2020-08-22 DIAGNOSIS — Q828 Other specified congenital malformations of skin: Secondary | ICD-10-CM

## 2020-08-22 DIAGNOSIS — M19049 Primary osteoarthritis, unspecified hand: Secondary | ICD-10-CM | POA: Insufficient documentation

## 2020-08-22 DIAGNOSIS — M6530 Trigger finger, unspecified finger: Secondary | ICD-10-CM | POA: Insufficient documentation

## 2020-08-22 DIAGNOSIS — R269 Unspecified abnormalities of gait and mobility: Secondary | ICD-10-CM | POA: Insufficient documentation

## 2020-08-23 NOTE — Progress Notes (Signed)
  Subjective:  Patient ID: De Burrs, female    DOB: 01-23-1946,  MRN: [id number] HPI Chief Complaint  Patient presents with  . Toe Pain    2nd toe (medial) right - callused area x several months, tried acid remover-no help, gets tender  . Nail Problem    3rd toenail left - thick and discolored x 4-5 months, took oral meds for fungus years ago for another toenail and it cleared up  . Leg Problem    Questions regarding limb length difference and how to correct  . New Patient (Initial Visit)    74 y.o. female presents with the above complaint.   ROS: Denies fever chills nausea vomiting muscle aches pains calf pain back pain chest pain shortness of breath.  No past medical history on file. No past surgical history on file. No current outpatient medications on file.  Allergies  Allergen Reactions  . Black Cohosh   . Sulfa Antibiotics    Review of Systems Objective:  There were no vitals filed for this visit.  General: Well developed, nourished, in no acute distress, alert and oriented x3   Dermatological: Skin is warm, dry and supple bilateral. Nails x 10 are well maintained; remaining integument appears unremarkable at this time. There are no open sores, no preulcerative lesions, no rash or signs of infection present.  Toenail third digit left foot demonstrates either nail dystrophy or dermatophytic infection.  Vascular: Dorsalis Pedis artery and Posterior Tibial artery pedal pulses are 2/4 bilateral with immedate capillary fill time. Pedal hair growth present. No varicosities and no lower extremity edema present bilateral.   Neruologic: Grossly intact via light touch bilateral. Vibratory intact via tuning fork bilateral. Protective threshold with Semmes Wienstein monofilament intact to all pedal sites bilateral. Patellar and Achilles deep tendon reflexes 2+ bilateral. No Babinski or clonus noted bilateral.   Musculoskeletal: No gross boney pedal deformities bilateral. No pain,  crepitus, or limitation noted with foot and ankle range of motion bilateral. Muscular strength 5/5 in all groups tested bilateral.  Mild bunion deformity resulting and a juxtaposition at the level of the DIPJ with irritation of that second toe.  She also has a slight leg length discrepancy the left leg appears to be about 1/4 inch shorter than the left  Gait: Unassisted, Nonantalgic.    Radiographs:  Radiographs taken today demonstrate second toe of the right foot does have a lateral deviation to it secondary to a mild bunion deformity.  Otherwise no acute findings are noted.  Assessment & Plan:   Assessment: Short left leg.  Nail dystrophy third left.  Plan: Discussed etiology pathology and surgical therapies at this point in time encouraged her to try and eighth inch heel lift left and if this works for her we can have a pair of orthotics made for her.  Also took samples of the nail today to be sent for pathologic evaluation provided her with a silicone digital sleeve for the second toe of the right foot.     Leotta Weingarten T. [place], [place]

## 2020-09-19 ENCOUNTER — Ambulatory Visit: Payer: Medicare Other | Admitting: Podiatry

## 2020-10-10 ENCOUNTER — Encounter: Payer: Self-pay | Admitting: Podiatry

## 2020-10-10 ENCOUNTER — Other Ambulatory Visit: Payer: Self-pay

## 2020-10-10 ENCOUNTER — Ambulatory Visit: Payer: Medicare Other | Admitting: Podiatry

## 2020-10-10 DIAGNOSIS — L603 Nail dystrophy: Secondary | ICD-10-CM

## 2020-10-10 DIAGNOSIS — M2170 Unequal limb length (acquired), unspecified site: Secondary | ICD-10-CM

## 2020-10-10 DIAGNOSIS — Z79899 Other long term (current) drug therapy: Secondary | ICD-10-CM

## 2020-10-10 MED ORDER — TERBINAFINE HCL 250 MG PO TABS: 250.0000 mg | ORAL_TABLET | Freq: Every day | ORAL | 0 refills | Status: DC

## 2020-10-10 NOTE — Progress Notes (Signed)
She presents today states that the lift that she has been using in the right shoe is doing much better she states that she feels a very subtle change in her lower back and her sciatic nerve.  She is also here for her pathology results on her toenails.  Objective: Vital signs are stable alert oriented x3.  Pulses are palpable.  No change in physical exam.  Pathology results do demonstrate dermatophytes for onychomycosis.  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  And onychomycosis.  Plan: Discussed etiology pathology and surgical therapies at this point time went ahead and started her on oral therapy we did discuss topical and laser.  We discussed the pros and cons of use of this oral therapy as well as the necessity for blood work she understands and is amendable to it.  We discussed the side effects and possible allergic reactions.  Dispensed Lamisil 30 and #1 p.o. daily we will follow-up with her in 1 month and she will f will see Liliane Channel today for orthotics.

## 2020-10-11 LAB — COMPREHENSIVE METABOLIC PANEL
AG Ratio: 1.9 (calc) (ref 1.0–2.5)
ALT: 20 U/L (ref 6–29)
AST: 17 U/L (ref 10–35)
Albumin: 4.4 g/dL (ref 3.6–5.1)
Alkaline phosphatase (APISO): 102 U/L (ref 37–153)
BUN: 16 mg/dL (ref 7–25)
CO2: 27 mmol/L (ref 20–32)
Calcium: 10 mg/dL (ref 8.6–10.4)
Chloride: 100 mmol/L (ref 98–110)
Creat: 0.87 mg/dL (ref 0.60–0.93)
Globulin: 2.3 g/dL (calc) (ref 1.9–3.7)
Glucose, Bld: 84 mg/dL (ref 65–139)
Potassium: 4.2 mmol/L (ref 3.5–5.3)
Sodium: 136 mmol/L (ref 135–146)
Total Bilirubin: 0.6 mg/dL (ref 0.2–1.2)
Total Protein: 6.7 g/dL (ref 6.1–8.1)

## 2020-10-16 ENCOUNTER — Telehealth: Payer: Self-pay | Admitting: Orthotics

## 2020-11-07 ENCOUNTER — Telehealth: Payer: Self-pay | Admitting: Podiatry

## 2020-11-07 NOTE — Telephone Encounter (Signed)
Patient called in requesting order for new blood test, please advise

## 2020-11-07 NOTE — Telephone Encounter (Signed)
Can you print out the requisition from her last visit and she can pick this up.

## 2020-11-16 ENCOUNTER — Encounter: Payer: Self-pay | Admitting: Podiatry

## 2020-11-16 ENCOUNTER — Ambulatory Visit: Payer: Medicare Other | Admitting: Podiatry

## 2020-11-16 ENCOUNTER — Other Ambulatory Visit: Payer: Medicare Other | Admitting: Orthotics

## 2020-11-16 ENCOUNTER — Other Ambulatory Visit: Payer: Self-pay

## 2020-11-16 DIAGNOSIS — Z79899 Other long term (current) drug therapy: Secondary | ICD-10-CM

## 2020-11-16 MED ORDER — TERBINAFINE HCL 250 MG PO TABS: 250.0000 mg | ORAL_TABLET | Freq: Every day | ORAL | 0 refills | Status: DC

## 2020-11-16 NOTE — Progress Notes (Signed)
She presents today for follow-up of her Lamisil.  She denies fever chills nausea vomiting muscle aches pains calf pain back pain chest pain shortness of breath.  Objective: Vital signs are stable alert oriented x3 appears that the fourth toenail is growing out very nicely at this point.  Assessment: Well-healing onychomycosis with long-term use of Lamisil.  Plan: At this point requesting a liver profile in another 90 days of Lamisil.  Follow-up with her in 4 months.  We will notify her as to the results of her liver profile.

## 2020-11-22 NOTE — Telephone Encounter (Signed)
Ca

## 2021-01-05 DIAGNOSIS — Z20822 Contact with and (suspected) exposure to covid-19: Secondary | ICD-10-CM | POA: Diagnosis not present

## 2021-01-10 DIAGNOSIS — Z1231 Encounter for screening mammogram for malignant neoplasm of breast: Secondary | ICD-10-CM | POA: Diagnosis not present

## 2021-01-10 DIAGNOSIS — M8588 Other specified disorders of bone density and structure, other site: Secondary | ICD-10-CM | POA: Diagnosis not present

## 2021-01-25 ENCOUNTER — Other Ambulatory Visit: Payer: Self-pay

## 2021-01-25 ENCOUNTER — Other Ambulatory Visit: Payer: Medicare Other

## 2021-01-25 DIAGNOSIS — Z20822 Contact with and (suspected) exposure to covid-19: Secondary | ICD-10-CM

## 2021-01-26 LAB — NOVEL CORONAVIRUS, NAA: SARS-CoV-2, NAA: NOT DETECTED

## 2021-01-26 LAB — SARS-COV-2, NAA 2 DAY TAT

## 2021-01-31 DIAGNOSIS — H25813 Combined forms of age-related cataract, bilateral: Secondary | ICD-10-CM | POA: Diagnosis not present

## 2021-01-31 DIAGNOSIS — H524 Presbyopia: Secondary | ICD-10-CM | POA: Diagnosis not present

## 2021-01-31 DIAGNOSIS — H5203 Hypermetropia, bilateral: Secondary | ICD-10-CM | POA: Diagnosis not present

## 2021-02-20 DIAGNOSIS — M8589 Other specified disorders of bone density and structure, multiple sites: Secondary | ICD-10-CM | POA: Diagnosis not present

## 2021-02-20 DIAGNOSIS — E618 Deficiency of other specified nutrient elements: Secondary | ICD-10-CM | POA: Diagnosis not present

## 2021-02-20 DIAGNOSIS — E7800 Pure hypercholesterolemia, unspecified: Secondary | ICD-10-CM | POA: Diagnosis not present

## 2021-02-20 DIAGNOSIS — Z024 Encounter for examination for driving license: Secondary | ICD-10-CM | POA: Diagnosis not present

## 2021-02-20 DIAGNOSIS — Z780 Asymptomatic menopausal state: Secondary | ICD-10-CM | POA: Diagnosis not present

## 2021-02-20 DIAGNOSIS — E559 Vitamin D deficiency, unspecified: Secondary | ICD-10-CM | POA: Diagnosis not present

## 2021-02-20 DIAGNOSIS — E538 Deficiency of other specified B group vitamins: Secondary | ICD-10-CM | POA: Diagnosis not present

## 2021-02-20 DIAGNOSIS — E039 Hypothyroidism, unspecified: Secondary | ICD-10-CM | POA: Diagnosis not present

## 2021-02-20 DIAGNOSIS — F5101 Primary insomnia: Secondary | ICD-10-CM | POA: Diagnosis not present

## 2021-02-20 DIAGNOSIS — G4733 Obstructive sleep apnea (adult) (pediatric): Secondary | ICD-10-CM | POA: Diagnosis not present

## 2021-02-22 DIAGNOSIS — E039 Hypothyroidism, unspecified: Secondary | ICD-10-CM | POA: Diagnosis not present

## 2021-02-22 DIAGNOSIS — G4733 Obstructive sleep apnea (adult) (pediatric): Secondary | ICD-10-CM | POA: Diagnosis not present

## 2021-02-22 DIAGNOSIS — E7800 Pure hypercholesterolemia, unspecified: Secondary | ICD-10-CM | POA: Diagnosis not present

## 2021-02-22 DIAGNOSIS — Z780 Asymptomatic menopausal state: Secondary | ICD-10-CM | POA: Diagnosis not present

## 2021-02-22 DIAGNOSIS — F5101 Primary insomnia: Secondary | ICD-10-CM | POA: Diagnosis not present

## 2021-02-22 DIAGNOSIS — E559 Vitamin D deficiency, unspecified: Secondary | ICD-10-CM | POA: Diagnosis not present

## 2021-02-22 DIAGNOSIS — E618 Deficiency of other specified nutrient elements: Secondary | ICD-10-CM | POA: Diagnosis not present

## 2021-02-22 DIAGNOSIS — Z024 Encounter for examination for driving license: Secondary | ICD-10-CM | POA: Diagnosis not present

## 2021-02-22 DIAGNOSIS — M8589 Other specified disorders of bone density and structure, multiple sites: Secondary | ICD-10-CM | POA: Diagnosis not present

## 2021-02-22 DIAGNOSIS — E538 Deficiency of other specified B group vitamins: Secondary | ICD-10-CM | POA: Diagnosis not present

## 2021-03-12 DIAGNOSIS — Z79899 Other long term (current) drug therapy: Secondary | ICD-10-CM | POA: Diagnosis not present

## 2021-03-13 ENCOUNTER — Ambulatory Visit: Payer: Medicare Other | Admitting: Podiatry

## 2021-03-13 ENCOUNTER — Other Ambulatory Visit: Payer: Self-pay

## 2021-03-13 ENCOUNTER — Encounter: Payer: Self-pay | Admitting: Podiatry

## 2021-03-13 DIAGNOSIS — L603 Nail dystrophy: Secondary | ICD-10-CM | POA: Diagnosis not present

## 2021-03-13 LAB — COMPLETE METABOLIC PANEL WITH GFR
AG Ratio: 1.9 (calc) (ref 1.0–2.5)
ALT: 29 U/L (ref 6–29)
AST: 34 U/L (ref 10–35)
Albumin: 4.4 g/dL (ref 3.6–5.1)
Alkaline phosphatase (APISO): 93 U/L (ref 37–153)
BUN: 18 mg/dL (ref 7–25)
CO2: 24 mmol/L (ref 20–32)
Calcium: 9.8 mg/dL (ref 8.6–10.4)
Chloride: 106 mmol/L (ref 98–110)
Creat: 0.78 mg/dL (ref 0.60–0.93)
GFR, Est African American: 87 mL/min/{1.73_m2} (ref >=60)
GFR, Est Non African American: 75 mL/min/{1.73_m2} (ref >=60)
Globulin: 2.3 g/dL (calc) (ref 1.9–3.7)
Glucose, Bld: 82 mg/dL (ref 65–99)
Potassium: 4.6 mmol/L (ref 3.5–5.3)
Sodium: 139 mmol/L (ref 135–146)
Total Bilirubin: 0.3 mg/dL (ref 0.2–1.2)
Total Protein: 6.7 g/dL (ref 6.1–8.1)

## 2021-03-13 MED ORDER — TERBINAFINE HCL 250 MG PO TABS: 250.0000 mg | ORAL_TABLET | Freq: Every day | ORAL | 0 refills | Status: DC

## 2021-03-13 NOTE — Progress Notes (Signed)
She presents today for follow-up of her nail fungus.  She is taking 120 days and states that it really does not look like is changed much she is referring to the third nail plate left foot.  Objective: Vital signs are stable alert oriented x3.  The nail appears to have some fungus distally but appears to be clearing proximally.  Assessment: Long-term therapy with onychomycosis Lamisil.  Plan: At this point we will go start Lamisil 250 mg tablets 1 tablet p.o. every other day.  I would like to follow-up with her in 3 months we dispensed 30 tablets.

## 2021-04-18 DIAGNOSIS — H903 Sensorineural hearing loss, bilateral: Secondary | ICD-10-CM | POA: Diagnosis not present

## 2021-04-18 DIAGNOSIS — H6122 Impacted cerumen, left ear: Secondary | ICD-10-CM | POA: Diagnosis not present

## 2021-04-18 DIAGNOSIS — R42 Dizziness and giddiness: Secondary | ICD-10-CM | POA: Diagnosis not present

## 2021-04-19 DIAGNOSIS — H90A21 Sensorineural hearing loss, unilateral, right ear, with restricted hearing on the contralateral side: Secondary | ICD-10-CM | POA: Diagnosis not present

## 2021-06-07 DIAGNOSIS — L814 Other melanin hyperpigmentation: Secondary | ICD-10-CM | POA: Diagnosis not present

## 2021-06-07 DIAGNOSIS — D1801 Hemangioma of skin and subcutaneous tissue: Secondary | ICD-10-CM | POA: Diagnosis not present

## 2021-06-07 DIAGNOSIS — L438 Other lichen planus: Secondary | ICD-10-CM | POA: Diagnosis not present

## 2021-06-07 DIAGNOSIS — L821 Other seborrheic keratosis: Secondary | ICD-10-CM | POA: Diagnosis not present

## 2021-06-07 DIAGNOSIS — L570 Actinic keratosis: Secondary | ICD-10-CM | POA: Diagnosis not present

## 2021-06-12 ENCOUNTER — Ambulatory Visit: Payer: Medicare Other | Admitting: Podiatry

## 2021-06-12 ENCOUNTER — Encounter: Payer: Self-pay | Admitting: Podiatry

## 2021-06-12 ENCOUNTER — Other Ambulatory Visit: Payer: Self-pay

## 2021-06-12 DIAGNOSIS — L603 Nail dystrophy: Secondary | ICD-10-CM | POA: Diagnosis not present

## 2021-06-13 NOTE — Progress Notes (Signed)
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  She states that I do still think that it is working does not seem to be changing anything.  Objective: Vital signs are stable is alert oriented x3.  She is completed 120 days of Lamisil and 1 every other day for 60 days.  Nothing seems to be changing there is still distal onycholysis with subungual debris and thickening of the toenail.  Assessment: Nail dystrophy third left.  Plan: Provided her with the 2 options.  Recommended that she either remove the nail or keep it filed thin and smooth.  She will continue to thin the nail and pain at.

## 2021-10-19 DIAGNOSIS — D509 Iron deficiency anemia, unspecified: Secondary | ICD-10-CM | POA: Diagnosis not present

## 2021-10-19 DIAGNOSIS — E7849 Other hyperlipidemia: Secondary | ICD-10-CM | POA: Diagnosis not present

## 2021-10-19 DIAGNOSIS — E559 Vitamin D deficiency, unspecified: Secondary | ICD-10-CM | POA: Diagnosis not present

## 2021-10-19 DIAGNOSIS — D513 Other dietary vitamin B12 deficiency anemia: Secondary | ICD-10-CM | POA: Diagnosis not present

## 2021-10-19 DIAGNOSIS — E162 Hypoglycemia, unspecified: Secondary | ICD-10-CM | POA: Diagnosis not present

## 2021-10-19 DIAGNOSIS — D518 Other vitamin B12 deficiency anemias: Secondary | ICD-10-CM | POA: Diagnosis not present

## 2021-10-19 DIAGNOSIS — R5381 Other malaise: Secondary | ICD-10-CM | POA: Diagnosis not present

## 2021-10-29 DIAGNOSIS — M8588 Other specified disorders of bone density and structure, other site: Secondary | ICD-10-CM | POA: Diagnosis not present

## 2021-10-29 DIAGNOSIS — E039 Hypothyroidism, unspecified: Secondary | ICD-10-CM | POA: Diagnosis not present

## 2021-10-29 DIAGNOSIS — E042 Nontoxic multinodular goiter: Secondary | ICD-10-CM | POA: Diagnosis not present

## 2021-12-13 ENCOUNTER — Other Ambulatory Visit: Payer: Self-pay | Admitting: Gastroenterology

## 2021-12-13 DIAGNOSIS — R1310 Dysphagia, unspecified: Secondary | ICD-10-CM

## 2021-12-21 ENCOUNTER — Ambulatory Visit
Admission: RE | Admit: 2021-12-21 | Discharge: 2021-12-21 | Disposition: A | Discharge status: Home / Self Care | Payer: Medicare Other | Source: Ambulatory Visit | Attending: Gastroenterology | Admitting: Gastroenterology

## 2021-12-21 DIAGNOSIS — K224 Dyskinesia of esophagus: Secondary | ICD-10-CM | POA: Diagnosis not present

## 2021-12-21 DIAGNOSIS — R1310 Dysphagia, unspecified: Secondary | ICD-10-CM

## 2021-12-21 DIAGNOSIS — K449 Diaphragmatic hernia without obstruction or gangrene: Secondary | ICD-10-CM | POA: Diagnosis not present

## 2022-01-14 DIAGNOSIS — E039 Hypothyroidism, unspecified: Secondary | ICD-10-CM | POA: Diagnosis not present

## 2022-01-14 DIAGNOSIS — E785 Hyperlipidemia, unspecified: Secondary | ICD-10-CM | POA: Diagnosis not present

## 2022-01-14 DIAGNOSIS — E559 Vitamin D deficiency, unspecified: Secondary | ICD-10-CM | POA: Diagnosis not present

## 2022-01-28 DIAGNOSIS — R1310 Dysphagia, unspecified: Secondary | ICD-10-CM | POA: Diagnosis not present

## 2022-01-28 DIAGNOSIS — K219 Gastro-esophageal reflux disease without esophagitis: Secondary | ICD-10-CM | POA: Diagnosis not present

## 2022-01-28 IMAGING — RF DG ESOPHAGUS
10 series · 14 of 24 positions shown · non-contrast
Comparison: NONE.

CLINICAL DATA: Previously vegetarian, recently added in meat,
reports sensation of meat/solid foods getting stuck in lower
esophagus.

EXAM:
ESOPHAGUS/BARIUM SWALLOW/TABLET STUDY
TECHNIQUE: Combined double and single contrast examination was performed using
effervescent crystals, high-density barium, and thin liquid barium.
This exam was performed by O[place], [place], and was supervised
and interpreted by Dr. Raquel Sobers.
FLUOROSCOPY TIME:  Radiation Exposure Index (as provided by the
fluoroscopic device):
Fluoroscopy Time: 3 minutes, 6 seconds
Number of Acquired Images: Multiple cine sequences obtained

[Series 1: sequence · 2 of 42 frames shown (1 of 8)]
[frame 7/42]
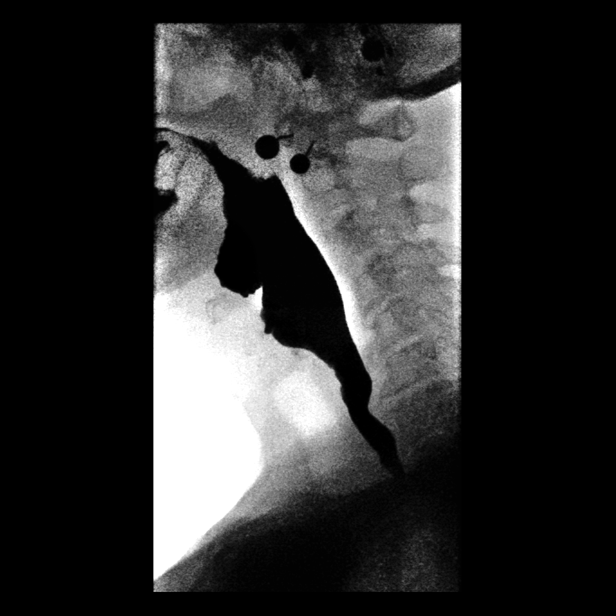
[frame 36/42]
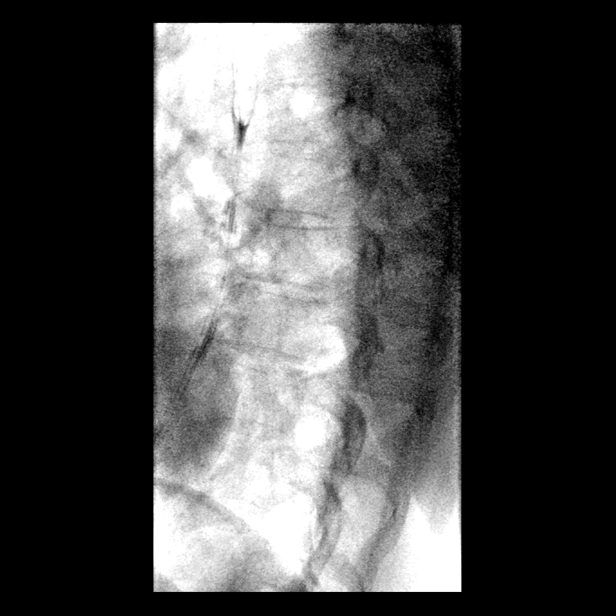

[Series 2: sequence · 1 of 30 frames shown (2 of 8)]
[frame 16/30]
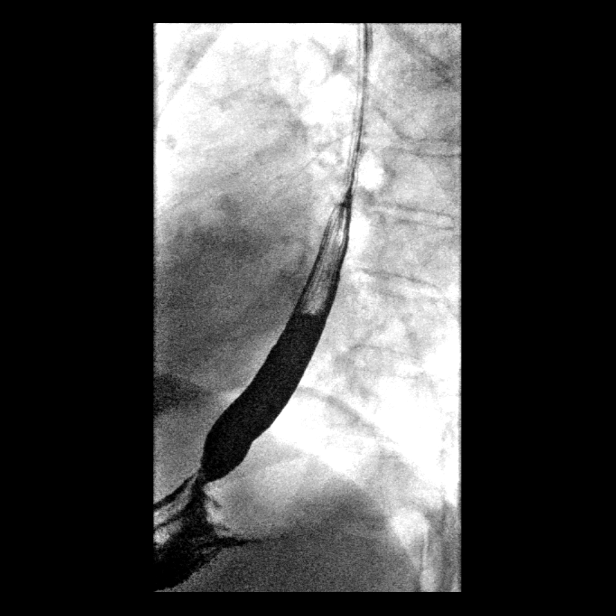

[Series 3: sequence · 2 of 41 frames shown (3 of 8)]
[frame 12/41]
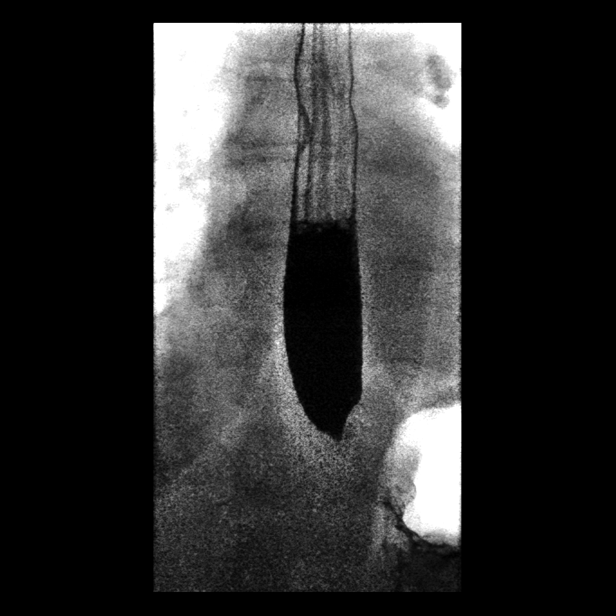
[frame 21/41]
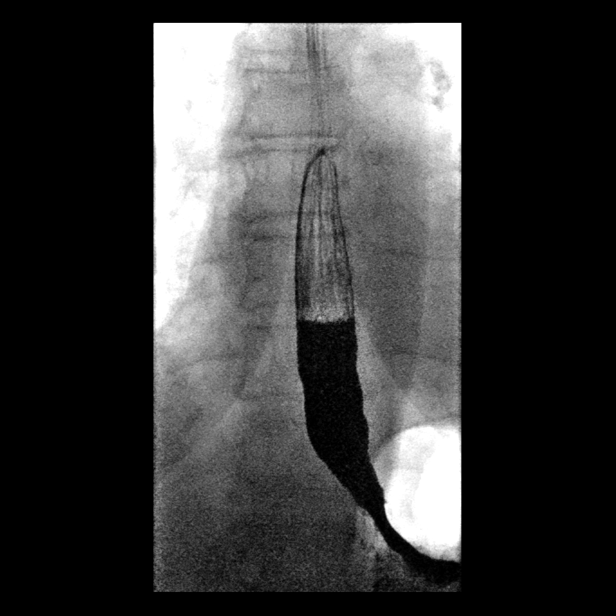

[Series 4: sequence · 1 of 15 frames shown (4 of 8)]
[frame 3/15]
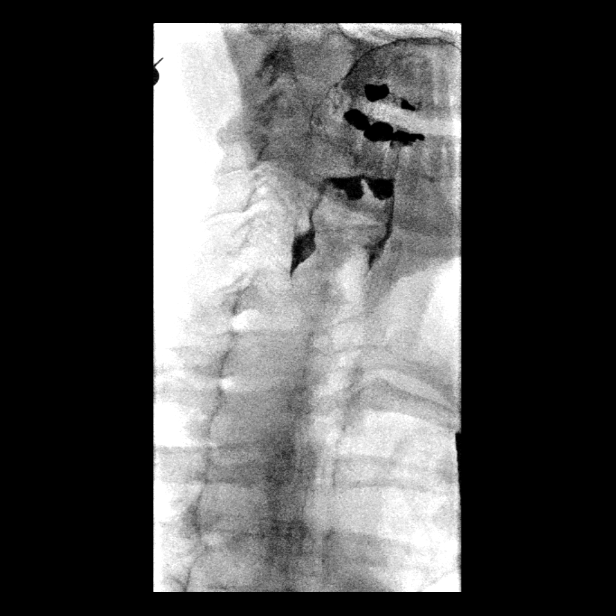

[Series 5: sequence · 2 of 70 frames shown (5 of 8)]
[frame 11/70]
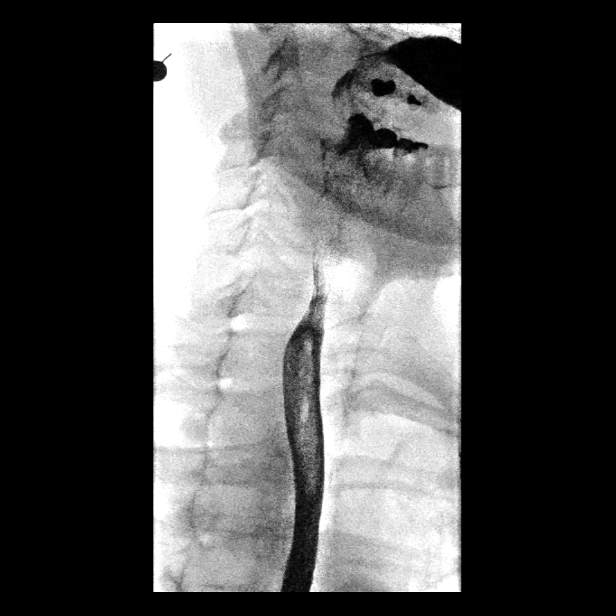
[frame 27/70]
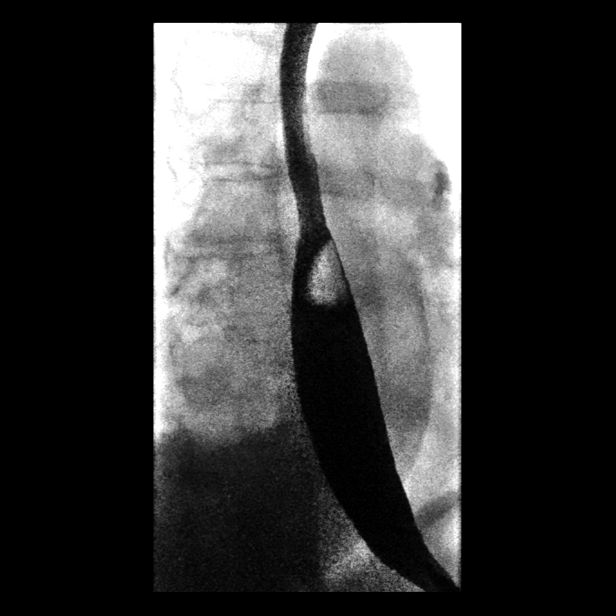

[Series 6: one shot · 1 of 1 slices shown (1 of 2)]
[im 1/1]
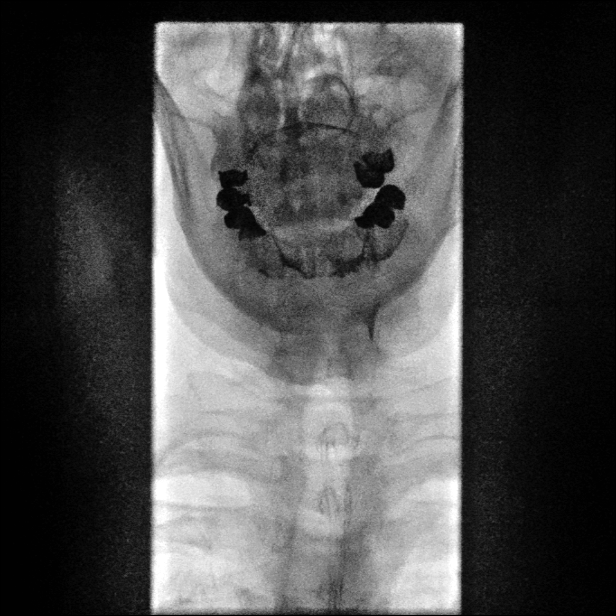

[Series 7: sequence · 1 of 17 frames shown (6 of 8)]
[frame 10/17]
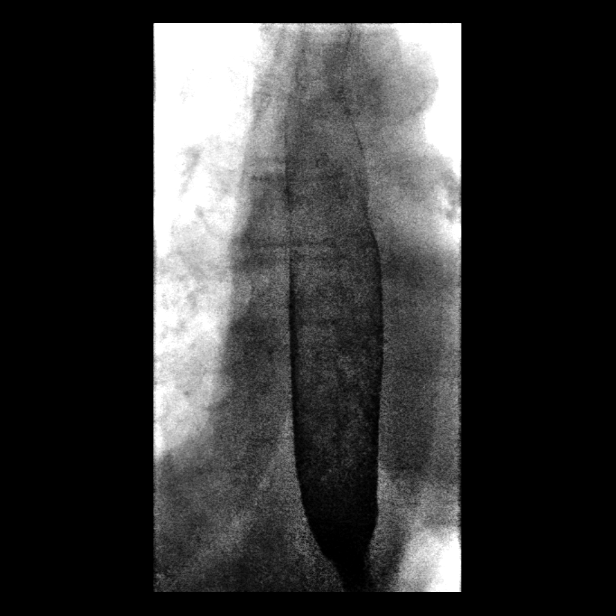

[Series 9: sequence · 2 of 227 frames shown (7 of 8)]
[frame 35/227]
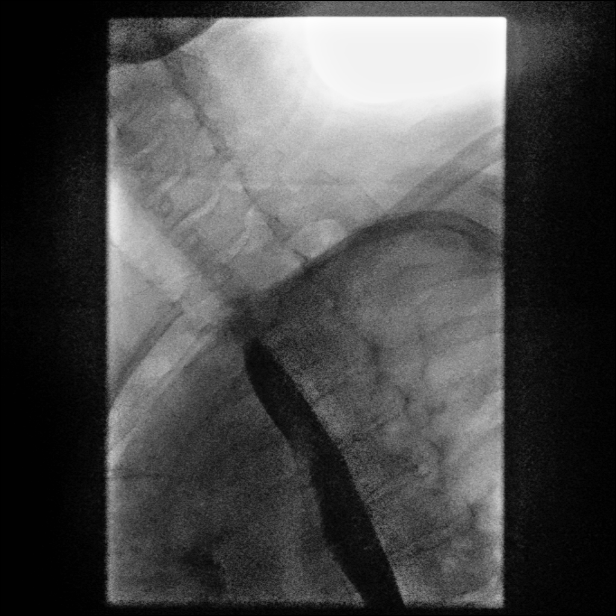
[frame 41/227]
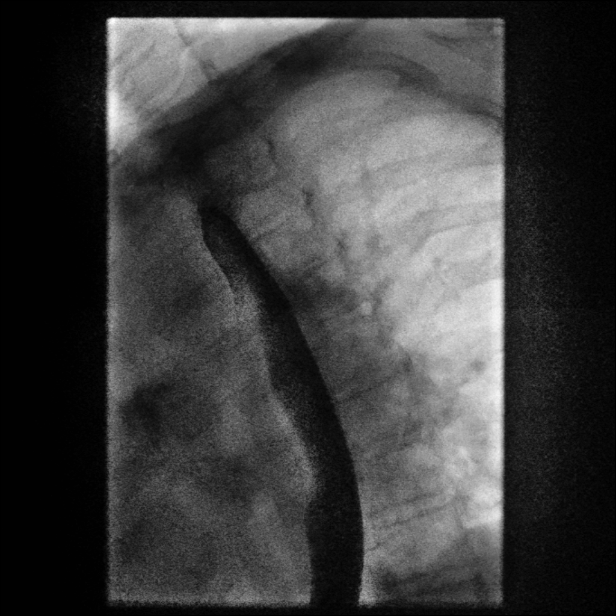

[Series 10: sequence · 1 of 3 frames shown (8 of 8)]
[frame 1/3]
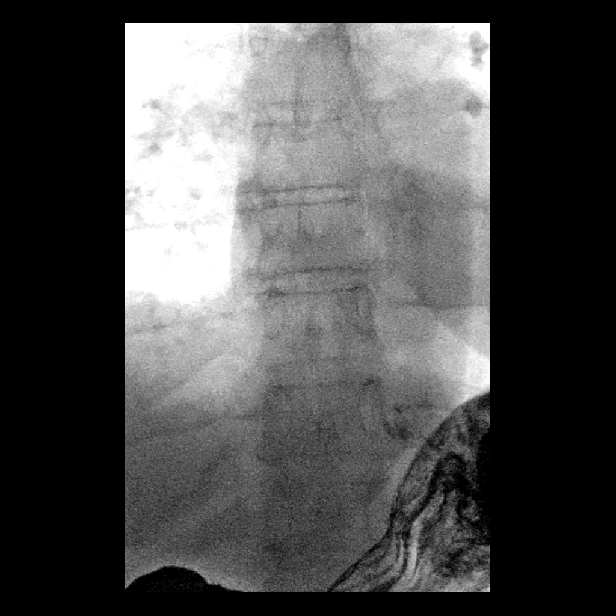

[Series 11: one shot · 1 of 1 slices shown (2 of 2)]
[im 1/1]
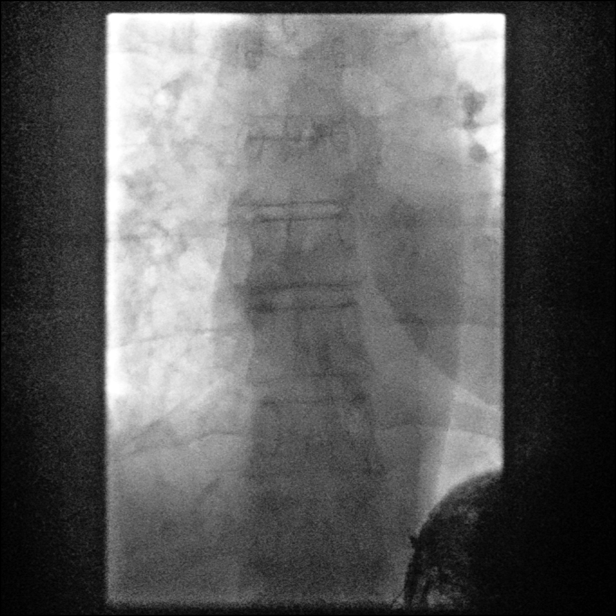

[14 of 24 positions shown; findings below may reference images not displayed]

FINDINGS: Swallowing: Appears normal. No vestibular penetration or aspiration
seen.

Pharynx: Unremarkable.

Esophagus: Normal appearance.

Esophageal motility: Intermittent interruption in the progression of
the contraction waves leading to mild amount or distal esophageal to
and fro movement of contrast column and incomplete esophageal
emptying, and suggest a mild nonspecific esophageal dysmotility
disorder

Hiatal Hernia: Very small, sliding-type.

Gastroesophageal reflux: None visualized.

Ingested 13mm barium tablet: Passed normally

Other: None.
IMPRESSION: 1.  Small sliding-type hiatal hernia

2.  Mild nonspecific esophageal dysmotility disorder.

## 2022-02-04 DIAGNOSIS — E039 Hypothyroidism, unspecified: Secondary | ICD-10-CM | POA: Diagnosis not present

## 2022-02-04 DIAGNOSIS — E042 Nontoxic multinodular goiter: Secondary | ICD-10-CM | POA: Diagnosis not present

## 2022-02-04 DIAGNOSIS — M8588 Other specified disorders of bone density and structure, other site: Secondary | ICD-10-CM | POA: Diagnosis not present

## 2022-02-20 ENCOUNTER — Ambulatory Visit (INDEPENDENT_AMBULATORY_CARE_PROVIDER_SITE_OTHER): Payer: Medicare Other | Admitting: Nurse Practitioner

## 2022-02-20 ENCOUNTER — Other Ambulatory Visit: Payer: Self-pay

## 2022-02-20 ENCOUNTER — Encounter (HOSPITAL_BASED_OUTPATIENT_CLINIC_OR_DEPARTMENT_OTHER): Payer: Self-pay | Admitting: Nurse Practitioner

## 2022-02-20 VITALS — Ht 64.0 in | Wt 147.8 lb

## 2022-02-20 DIAGNOSIS — E039 Hypothyroidism, unspecified: Secondary | ICD-10-CM | POA: Insufficient documentation

## 2022-02-20 DIAGNOSIS — F4329 Adjustment disorder with other symptoms: Secondary | ICD-10-CM | POA: Insufficient documentation

## 2022-02-20 DIAGNOSIS — E042 Nontoxic multinodular goiter: Secondary | ICD-10-CM | POA: Diagnosis not present

## 2022-02-20 DIAGNOSIS — B002 Herpesviral gingivostomatitis and pharyngotonsillitis: Secondary | ICD-10-CM | POA: Insufficient documentation

## 2022-02-20 DIAGNOSIS — E063 Autoimmune thyroiditis: Secondary | ICD-10-CM

## 2022-02-20 DIAGNOSIS — E539 Vitamin B deficiency, unspecified: Secondary | ICD-10-CM | POA: Insufficient documentation

## 2022-02-20 DIAGNOSIS — R296 Repeated falls: Secondary | ICD-10-CM | POA: Diagnosis not present

## 2022-02-20 DIAGNOSIS — R400 Somnolence: Secondary | ICD-10-CM | POA: Diagnosis not present

## 2022-02-20 DIAGNOSIS — M8580 Other specified disorders of bone density and structure, unspecified site: Secondary | ICD-10-CM | POA: Insufficient documentation

## 2022-02-20 DIAGNOSIS — F5101 Primary insomnia: Secondary | ICD-10-CM | POA: Insufficient documentation

## 2022-02-20 DIAGNOSIS — R1310 Dysphagia, unspecified: Secondary | ICD-10-CM | POA: Insufficient documentation

## 2022-02-20 DIAGNOSIS — E038 Other specified hypothyroidism: Secondary | ICD-10-CM | POA: Diagnosis not present

## 2022-02-20 DIAGNOSIS — H8110 Benign paroxysmal vertigo, unspecified ear: Secondary | ICD-10-CM | POA: Insufficient documentation

## 2022-02-20 DIAGNOSIS — E618 Deficiency of other specified nutrient elements: Secondary | ICD-10-CM | POA: Insufficient documentation

## 2022-02-20 DIAGNOSIS — E7800 Pure hypercholesterolemia, unspecified: Secondary | ICD-10-CM | POA: Insufficient documentation

## 2022-02-20 DIAGNOSIS — Z780 Asymptomatic menopausal state: Secondary | ICD-10-CM | POA: Insufficient documentation

## 2022-02-20 DIAGNOSIS — B351 Tinea unguium: Secondary | ICD-10-CM | POA: Insufficient documentation

## 2022-02-20 DIAGNOSIS — G4733 Obstructive sleep apnea (adult) (pediatric): Secondary | ICD-10-CM | POA: Diagnosis not present

## 2022-02-20 HISTORY — DX: Autoimmune thyroiditis: E06.3

## 2022-02-20 HISTORY — DX: Pure hypercholesterolemia, unspecified: E78.00

## 2022-02-20 HISTORY — DX: Obstructive sleep apnea (adult) (pediatric): G47.33

## 2022-02-20 NOTE — Patient Instructions (Signed)
Thank you for choosing [place] at [facility] for your Primary Care needs. I am excited for the opportunity to partner with you to meet your health care goals. It was a pleasure meeting you today! ? ?Recommendations from today's visit: ?I would like to review your previous labs to see if there is anything that stands out that could be contributing to your falls. This will help me determine if we need to look at anything else or I can see if anything stands out that may contribute.  ?I have sent the referral to neurology for evaluation for your falls.  ?I do want you to let me know immediately if you have any additional falls and specifically if there are any injuries with this.  ?  ? ?Information on diet, exercise, and health maintenance recommendations are listed below. This is information to help you be sure you are on track for optimal health and monitoring.  ? ?Please look over this and let us know if you have any questions or if you have completed any of the health maintenance outside of [place] so that we can be sure your records are up to date.  ?___________________________________________________________ ?About Me: ?I am an Adult-Geriatric Nurse Practitioner with a background in caring for patients for more than 20 years with a strong intensive care background. I provide primary care and sports medicine services to patients age 40 and older within this office. My education had a strong focus on caring for the older adult population, which I am passionate about. I am also the director of the APP Fellowship with [facility].  ? ?My desire is to provide you with the best service through preventive medicine and supportive care. I consider you a part of the medical team and value your input. I work diligently to ensure that you are heard and your needs are met in a safe and effective manner. I want you to feel comfortable with me as your provider and want you to know that your health  concerns are important to me. ? ?For your information, our office hours are: ?Monday, Tuesday, and Thursday 8:00 AM - 5:00 PM ?Wednesday and Friday 8:00 AM - 12:00 PM.  ? ?In my time away from the office I am teaching new APP's within the system and am unavailable, but my partner, Dr. Burnard Bunting is in the office for emergent needs.  ? ?If you have questions or concerns, please call our office at [phone] or send [place] a MyChart message and we will respond as quickly as possible.  ?____________________________________________________________ ?MyChart:  ?For all urgent or time sensitive needs we ask that you please call the office to avoid delays. Our number is [phone]. ?MyChart is not constantly monitored and due to the large volume of messages a day, replies may take up to 72 business hours. ? ?MyChart Policy: ?MyChart allows for you to see your visit notes, after visit summary, provider recommendations, lab and tests results, make an appointment, request refills, and contact your provider or the office for non-urgent questions or concerns. Providers are seeing patients during normal business hours and do not have built in time to review MyChart messages.  ?We ask that you allow a minimum of 3 business days for responses to [employer]. For this reason, please do not send urgent requests through [place]. Please call the office at [phone]. ?New and ongoing conditions may require a visit. We have virtual and in person visit available for your convenience.  ?Complex  MyChart concerns may require a visit. Your provider may request you schedule a virtual or in person visit to ensure we are providing the best care possible. ?MyChart messages sent after 11:00 AM on Friday will not be received by the provider until Monday morning.  ?  ?Lab and Test Results: ?You will receive your lab and test results on MyChart as soon as they are completed and results have been sent by the lab or testing facility. Due to this  service, you will receive your results BEFORE your provider.  ?I review lab and tests results each morning prior to seeing patients. Some results require collaboration with other providers to ensure you are receiving the most appropriate care. For this reason, we ask that you please allow a minimum of 3-5 business days from the time the ALL results have been received for your provider to receive and review lab and test results and contact you about these.  ?Most lab and test result comments from the provider will be sent through [place]. Your provider may recommend changes to the plan of care, follow-up visits, repeat testing, ask questions, or request an office visit to discuss these results. You may reply directly to this message or call the office at [phone] to provide information for the provider or set up an appointment. ?In some instances, you will be called with test results and recommendations. Please let us know if this is preferred and we will make note of this in your chart to provide this for you.    ?If you have not heard a response to your lab or test results in 5 business days from all results returning to [place], please call the office to let us know. We ask that you please avoid calling prior to this time unless there is an emergent concern. Due to high call volumes, this can delay the resulting process. ? ?After Hours: ?For all non-emergency after hours needs, please call the office at [phone] and select the option to reach the on-call provider service. On-call services are shared between multiple [place] offices and therefore it will not be possible to speak directly with your provider. On-call providers may provide medical advice and recommendations, but are unable to provide refills for maintenance medications.  ?For all emergency or urgent medical needs after normal business hours, we recommend that you seek care at the closest Urgent Care or Emergency Department to ensure  appropriate treatment in a timely manner.  ?[facility] [place] has a 24 hour emergency room located on the ground floor for your convenience.  ? ?Urgent Concerns During the Business Day ?Providers are seeing patients from 8AM to [place] with a busy schedule and are most often not able to respond to non-urgent calls until the end of the day or the next business day. ?If you should have URGENT concerns during the day, please call and speak to the nurse or schedule a same day appointment so that we can address your concern without delay.  ? ?Thank you, again, for choosing me as your health care partner. I appreciate your trust and look forward to learning more about you.  ? ?Worthy Keeler, DNP, AGNP-c ?___________________________________________________________ ? ?Health Maintenance Recommendations ?Screening Testing ?Mammogram ?Every 1 -2 years based on history and risk factors ?Starting at age 54 ?Pap Smear ?Ages 21-39 every 3 years ?Ages 16-65 every 5 years with HPV testing ?More frequent testing may be required based on results and history ?Colon Cancer Screening ?Every 1-10 years based on  test performed, risk factors, and history ?Starting at age 41 ?Bone Density Screening ?Every 2-10 years based on history ?Starting at age 29 for women ?Recommendations for men differ based on medication usage, history, and risk factors ?AAA Screening ?One time ultrasound ?Men 45-23 years old who have every smoked ?Lung Cancer Screening ?Low Dose Lung CT every 12 months ?Age 80-80 years with a 30 pack-year smoking history who still smoke or who have quit within the last 15 years ? ?Screening Labs ?Routine  Labs: Complete Blood Count (CBC), Complete Metabolic Panel (CMP), Cholesterol (Lipid Panel) ?Every 6-12 months based on history and medications ?May be recommended more frequently based on current conditions or previous results ?Hemoglobin A1c Lab ?Every 3-12 months based on history and previous results ?Starting at  age 30 or earlier with diagnosis of diabetes, high cholesterol, BMI >26, and/or risk factors ?Frequent monitoring for patients with diabetes to ensure blood sugar control ?Thyroid Panel (TSH w/ T3 & T4) ?Ev
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
## 2022-02-28 ENCOUNTER — Encounter (HOSPITAL_BASED_OUTPATIENT_CLINIC_OR_DEPARTMENT_OTHER): Payer: Self-pay | Admitting: Nurse Practitioner

## 2022-02-28 DIAGNOSIS — R296 Repeated falls: Secondary | ICD-10-CM

## 2022-02-28 HISTORY — DX: Repeated falls: R29.6

## 2022-02-28 NOTE — Assessment & Plan Note (Signed)
Controlled with levothyroxine 11mg at this time.  ?Thyroid nodules present and monitored.  ?Recommend continued monitoring and f/u. Labs UTD, will need to review records from previous provider and will make decisions for changes to plan of care based on findings as necessary.  ?
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
## 2022-02-28 NOTE — Assessment & Plan Note (Signed)
See OSA ?

## 2022-02-28 NOTE — Assessment & Plan Note (Signed)
See hypothyroid.

## 2022-02-28 NOTE — Assessment & Plan Note (Signed)
>>  ASSESSMENT AND PLAN FOR HYPOTHYROIDISM WRITTEN ON 02/28/2022  8:21 PM BY EARLY, SARA E, NP  Controlled with levothyroxine 84mg at this time.  Thyroid nodules present and monitored.  Recommend continued monitoring and f/u. Labs UTD, will need to review records from previous provider and will make decisions for changes to plan of care based on findings as necessary.
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
## 2022-03-06 DIAGNOSIS — Z1231 Encounter for screening mammogram for malignant neoplasm of breast: Secondary | ICD-10-CM | POA: Diagnosis not present

## 2022-03-06 DIAGNOSIS — R296 Repeated falls: Secondary | ICD-10-CM | POA: Diagnosis not present

## 2022-03-11 DIAGNOSIS — E039 Hypothyroidism, unspecified: Secondary | ICD-10-CM | POA: Diagnosis not present

## 2022-03-11 DIAGNOSIS — E538 Deficiency of other specified B group vitamins: Secondary | ICD-10-CM | POA: Diagnosis not present

## 2022-03-11 DIAGNOSIS — E7800 Pure hypercholesterolemia, unspecified: Secondary | ICD-10-CM | POA: Diagnosis not present

## 2022-03-11 DIAGNOSIS — R296 Repeated falls: Secondary | ICD-10-CM | POA: Diagnosis not present

## 2022-03-11 DIAGNOSIS — E618 Deficiency of other specified nutrient elements: Secondary | ICD-10-CM | POA: Diagnosis not present

## 2022-03-11 DIAGNOSIS — E559 Vitamin D deficiency, unspecified: Secondary | ICD-10-CM | POA: Diagnosis not present

## 2022-03-12 ENCOUNTER — Encounter: Payer: Self-pay | Admitting: Neurology

## 2022-03-12 ENCOUNTER — Telehealth: Payer: Self-pay

## 2022-03-12 ENCOUNTER — Telehealth: Payer: Self-pay | Admitting: Neurology

## 2022-03-12 ENCOUNTER — Ambulatory Visit: Payer: Medicare Other | Admitting: Neurology

## 2022-03-12 VITALS — BP 110/70 | HR 63 | Ht 64.0 in | Wt 150.5 lb

## 2022-03-12 DIAGNOSIS — W19XXXA Unspecified fall, initial encounter: Secondary | ICD-10-CM | POA: Insufficient documentation

## 2022-03-12 DIAGNOSIS — R296 Repeated falls: Secondary | ICD-10-CM

## 2022-03-12 DIAGNOSIS — W19XXXS Unspecified fall, sequela: Secondary | ICD-10-CM

## 2022-03-12 NOTE — Progress Notes (Addendum)
? ?Chief Complaint  ?Patient presents with  ? New Patient (Initial Visit)  ?  Rm 15. Accompanied by husband, Joneen Boers and daughter, Maudie Mercury. ?NP/Paper/Gretchen Julien Girt [facility] [place]/recurrent falls ?Pt reports 3 total falls, two falls on March 15, March 26.  ? ? ? ? ?ASSESSMENT AND PLAN ? ?Gwendolyn Perez is a 76 y.o. female  ?Falling episodes ? Essentially fairly normal neurological examinations, ? Differentiation diagnoses include orthostatic blood pressure change, need to rule out brainstem/cerebellum structural abnormality ? MRI of brain ? Get Laboratory results from her primary care ? ?Addendum: Reviewed laboratory evaluation from March 11, 2022, magnesium slight elevated 2.4, total cholesterol was 213, LDL 137, TSH was elevated to 5.2, normal free T41.16, normal free T32.6, normal CMP, B12 914, FSH was 45.6, estradiol was less than 5, hemoglobin was 14.4, iron level was within normal limits 95, A1c 5.4, insulin level was within normal limits at 10.9, ferritin was 129, vitamin D level 88.3, ? ?More Laboratory evaluation from primary care Dr. Yaakov Guthrie, Iodine plasma level was significantly elevated 62.4, creatinine 0.84, glucose 65, normal TSH 1.67 March 11, 2022 ? ? ? ?DIAGNOSTIC DATA (LABS, IMAGING, TESTING) ?- I reviewed patient records, labs, notes, testing and imaging myself where available. ? ? ?MEDICAL HISTORY: ? ?Gwendolyn Perez is a 76 year old female, accompanied by her husband, and daughter Maudie Mercury, seen in request by her primary care doctor Dr. Jacelyn Grip, [place] for episode of falling, initial evaluation was on March 12, 2022 ? ?I reviewed and summarized the referring note. PMHX. ?Hypthyrodism ?GERD ? ?She is very healthy, still work as a Geophysicist/field seismologist, had unexpected episodes of fall ? ?Initially was on February 15, 2022, around noon time, she was bending down to pick up her iron into the socket, with a swirling chair besides her, when she tried to stand up, she fell unexpectedly, ? ?Second episode was few weeks  later, she was shampooing her hair, standing in the tub, when she turned, she fell out of the tub, ? 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 ?She complains of lower back pain from impact, but otherwise denies sensory loss of bilateral upper or lower extremity, denies weakness, denies bowel and bladder incontinence ? ?She denies warning signs prior to falling, no chest pain, no heart palpitation, denied loss of consciousness, ? ?There is no orthostatic blood pressure change on today's examination, ? ?PHYSICAL EXAM: ?  ?Vitals:  ? 03/12/22 1255  ?BP: 110/70  ?Pulse: 63  ?Weight: 150 lb 8 oz (68.3 kg)  ?Height: '5\' 4"'$  (1.626 m)  ? ?BP lying down 124/72, 61; sitting up 131/68, 58; standing up 135/76, 66 ? ? ?Body mass index is 25.83 kg/m?. ? ?PHYSICAL EXAMNIATION: ? ?Gen: NAD, conversant, well nourised, well groomed                     ?Cardiovascular: Regular rate rhythm, no peripheral edema, warm, nontender. ?Eyes: Conjunctivae clear without exudates or hemorrhage ?Neck: Supple, no carotid bruits. ?Pulmonary: Clear to auscultation bilaterally  ? ?NEUROLOGICAL EXAM: ? ?MENTAL STATUS: ?Speech: ?   Speech is normal; fluent and spontaneous with normal comprehension.  ?Cognition: ?    Orientation to time, place and person ?    Normal recent and remote memory ?  Normal Attention span and concentration ?    Normal Language, naming, repeating,spontaneous speech ?    Fund of knowledge ?  ?CRANIAL NERVES: ?CN II: Visual fields are full to confrontation. Pupils are round equal and briskly reactive to light. ?CN III, IV, VI: extraocular movement are normal. No ptosis. ?CN V: Facial sensation is intact to light touch ?CN VII: Face is symmetric with normal eye closure  ?CN VIII: Hearing is normal to causal conversation. ?CN IX, X: Phonation is  normal. ?CN XI: Head turning and shoulder shrug are intact ? ?MOTOR: ?There is no pronator drift of out-stretched arms. Muscle bulk and tone are normal. Muscle strength is normal. ? ?REFLEXES: ?Reflexes are 2+ and symmetric at the biceps, triceps, knees, and ankles. Plantar responses are flexor. ? ?SENSORY: ?Intact to light touch, pinprick and vibratory sensation are intact in fingers and toes. ? ?COORDINATION: ?There is no trunk or limb dysmetria noted. ? ?GAIT/STANCE: ?Posture is normal. Gait is steady with normal steps, base, arm swing, and turning. Heel and toe walking are normal.  Slight difficulty with tandem walking is expected with her age. ? ?REVIEW OF SYSTEMS:  ?Full 14 system review of systems performed and notable only for as above ?All other review of systems were negative. ? ? ?ALLERGIES: ?Allergies  ?Allergen Reactions  ? Black Cohosh Rash  ? Sulfa Antibiotics Rash  ? ? ?HOME MEDICATIONS: ?Current Outpatient Medications  ?Medication Sig Dispense Refill  ? acetaminophen (TYLENOL) 500 MG tablet 2 tablets as needed    ? estradiol (ESTRACE) 0.1 MG/GM vaginal cream Place 1 Applicatorful vaginally daily.    ? levothyroxine (SYNTHROID) 50 MCG tablet 1 tablet in the morning on an empty stomach    ? omeprazole (PRILOSEC) 40 MG capsule Take 40 mg by mouth every morning.    ? valACYclovir (VALTREX) 1000 MG tablet 1/2 tablet    ? ?No current facility-administered medications for this visit.  ? ? ?PAST MEDICAL HISTORY: ?Past Medical History:  ?Diagnosis Date  ? Arthritis 10 yrs ago  ? Thumbs,coxic, neck, hip, knees  ? GERD (gastroesophageal reflux disease) March 2020  ? Hiatal hernia  ? Heart murmur Birth  ? Functional heart murmur  ? Hypercholesterolemia 02/20/2022  ? Obstructive sleep apnea (adult) (pediatric) 02/20/2022  ? Thyroid disease Hypothyroidism  ? Mildly affected  ? ? ?PAST SURGICAL HISTORY: ?Past Surgical History:  ?Procedure Laterality Date  ? ABDOMINAL HYSTERECTOMY  2000  ? By laparoscopic surgery   ? ? ?FAMILY HISTORY: ?Family History  ?Problem Relation Age of Onset  ? Healthy Mother   ? Cancer Father   ? Hearing loss Paternal Grandfather   ? Arthritis Paternal Grandmother   ? Arthritis Paternal Aunt   ? Hearing loss Paternal Aunt   ? ? ?SOCIAL HISTORY: ?Social History  ? ?Socioeconomic History  ? Marital status: Married  ?  Spouse name: Not on file  ? Number of children: Not on file  ? Years of education: Not on file  ? Highest education level: Not on file  ?Occupational History  ? Not on file  ?Tobacco Use  ? Smoking status: Never  ?  Passive exposure: Never  ? Smokeless tobacco: Never  ?Vaping Use  ? Vaping Use: Never used  ?Substance and Sexual Activity  ? Alcohol use: Yes  ?  Alcohol/week: 2.0 standard drinks  ?  Types: 2 Glasses of wine per week  ?  Comment: Socially when with friends  ? Drug use: Never  ? Sexual activity: Yes  ?  Comment: Hysterectomy  ?Other Topics Concern  ? Not on file  ?Social History Narrative  ? Not on file  ? ?Social Determinants of Health  ? ?Financial Resource Strain: Not on file  ?Food Insecurity: Not on file  ?Transportation Needs: Not on file  ?Physical Activity: Not on file  ?Stress: Not on file  ?Social Connections: Not on file  ?Intimate Partner Violence: Not on file  ? ? ? ? ?Marcial Pacas, M.D. Ph.D. ? ?[facility] ?[address], [address] ?[place], [place] [address] ?Ph: [phone] ?Fax: [phone] ? ?CC:  Marylynn Pearson, MD ?[address], [address] ?[place],  [place] [address]  Vernie Shanks, MD   ?

## 2022-03-12 NOTE — Telephone Encounter (Signed)
[employer] medicare order sent to [facility], [facility] they will reach out to the patient to schedule.  

## 2022-03-12 NOTE — Telephone Encounter (Signed)
Called pt to schedule sleep consult - pt refused appt and stated she "did not want anything sleep related on her records. My PCP misunderstood me and I don't wish to have anything with sleep done. I want a full Neuro work up." Pt is scheduled for New pt appt with Dr. Krista Blue today at 1:00pm.  ?

## 2022-03-13 ENCOUNTER — Encounter: Payer: Self-pay | Admitting: Neurology

## 2022-03-14 ENCOUNTER — Encounter: Payer: Self-pay | Admitting: Neurology

## 2022-03-17 ENCOUNTER — Ambulatory Visit
Admission: RE | Admit: 2022-03-17 | Discharge: 2022-03-17 | Disposition: A | Discharge status: Home / Self Care | Payer: Medicare Other | Source: Ambulatory Visit | Attending: Neurology | Admitting: Neurology

## 2022-03-17 DIAGNOSIS — R296 Repeated falls: Secondary | ICD-10-CM

## 2022-03-17 DIAGNOSIS — R270 Ataxia, unspecified: Secondary | ICD-10-CM | POA: Diagnosis not present

## 2022-03-17 DIAGNOSIS — W19XXXS Unspecified fall, sequela: Secondary | ICD-10-CM

## 2022-03-19 ENCOUNTER — Ambulatory Visit: Payer: Medicare Other | Admitting: Podiatry

## 2022-03-19 ENCOUNTER — Encounter: Payer: Self-pay | Admitting: Podiatry

## 2022-03-19 DIAGNOSIS — L600 Ingrowing nail: Secondary | ICD-10-CM

## 2022-03-19 DIAGNOSIS — L603 Nail dystrophy: Secondary | ICD-10-CM

## 2022-03-19 MED ORDER — NEOMYCIN-POLYMYXIN-HC 1 % OT SOLN: OTIC | 1 refills | Status: DC

## 2022-03-19 NOTE — Progress Notes (Signed)
She presents today states my first toenail come off my third toenail hurts that she points to the left foot.  And she wants the third toenail removed today. ? ?Objective: Vital signs are stable alert oriented x3.  Pulses are palpable.  Neurologic sensorium is intact deep tendon reflexes are intact.  Third toe does demonstrate a mild malleus but a nail dystrophy which is painful on palpation.  The hallux nail left does come off but there is a new nail already growing out by about 75%. ? ?Assessment: Painful nail third digit left foot. ? ?Plan: Chemical matricectomy total nail avulsions performed today after local anesthetic was administered she tolerated procedure well without complications.  I will follow-up with her in 2 weeks.  She was given both oral and written instructions for the care and soaking of the toe and as well as a prescription for Cortisporin Otic to be applied twice daily after soaking. ?

## 2022-03-19 NOTE — Patient Instructions (Signed)

## 2022-03-21 ENCOUNTER — Ambulatory Visit: Payer: Medicare Other | Admitting: Podiatry

## 2022-04-04 ENCOUNTER — Encounter: Payer: Self-pay | Admitting: Podiatry

## 2022-04-04 ENCOUNTER — Ambulatory Visit: Payer: Medicare Other | Admitting: Podiatry

## 2022-04-04 DIAGNOSIS — L03032 Cellulitis of left toe: Secondary | ICD-10-CM

## 2022-04-04 MED ORDER — MUPIROCIN 2 % EX OINT: 1.0000 "application " | TOPICAL_OINTMENT | Freq: Two times a day (BID) | CUTANEOUS | 0 refills | Status: DC

## 2022-04-04 MED ORDER — DOXYCYCLINE HYCLATE 100 MG PO TABS: 100.0000 mg | ORAL_TABLET | Freq: Two times a day (BID) | ORAL | 0 refills | Status: DC

## 2022-04-04 NOTE — Progress Notes (Signed)
She presents today for follow-up of her nail procedure third digit of her left foot she states that is doing okay its pretty sore now it was doing okay the other day and it was red for a while and then all of a sudden discussed this dark spot right in the middle.  She states that she still continues to soak it but it hurts when she puts a Band-Aid over it with ointment on it. ? ?Objective: Vital signs are stable alert oriented x3 third toe left does demonstrate some mild erythema distally distal to the DIPJ and central nail bed appears that it is has developed an eschar most likely this is a chemical burn that has resulted in an death of the nailbed.  But there are some small areas surrounding it that do does demonstrate some epithelialization. ? ?Assessment: Probable mild paronychia with some nailbed destruction secondary to chemical burn. ? ?Plan: At this point we are going to start the use of the Bactroban ointment on a daily basis still continue to soak the toe in Epsom salts and warm water were also going to start doxycycline she had undergone a follow-up with each other in about 2 weeks. ?

## 2022-04-08 ENCOUNTER — Ambulatory Visit (INDEPENDENT_AMBULATORY_CARE_PROVIDER_SITE_OTHER): Payer: Medicare Other

## 2022-04-08 ENCOUNTER — Ambulatory Visit: Payer: Medicare Other

## 2022-04-08 ENCOUNTER — Ambulatory Visit: Payer: Medicare Other | Admitting: Podiatry

## 2022-04-08 DIAGNOSIS — L97529 Non-pressure chronic ulcer of other part of left foot with unspecified severity: Secondary | ICD-10-CM

## 2022-04-08 MED ORDER — AMOXICILLIN-POT CLAVULANATE 875-125 MG PO TABS: 1.0000 | ORAL_TABLET | Freq: Two times a day (BID) | ORAL | 0 refills | Status: DC

## 2022-04-08 MED ORDER — FLUCONAZOLE 150 MG PO TABS: 150.0000 mg | ORAL_TABLET | Freq: Once | ORAL | 0 refills | Status: AC

## 2022-04-08 MED ORDER — NITROGLYCERIN 2 % TD OINT: 0.5000 [in_us] | TOPICAL_OINTMENT | Freq: Four times a day (QID) | TRANSDERMAL | 0 refills | Status: DC

## 2022-04-08 NOTE — Patient Instructions (Addendum)
STOP taking doxycyline. Start taking Augmentin (amoxicillin-clavulanate). Take a probiotic or eat yogurt with live culture while on it to maintain gut health ? ?Apply the nitroglycerin ointment 4x daily to base of toe ? ?Soak in warm water (w/o betadine or epsom) for 10-15 minutes twice a day. Apply mupirocin ointment and band aid. Keep covered with ointment at all times ?

## 2022-04-09 ENCOUNTER — Encounter: Payer: Self-pay | Admitting: Podiatry

## 2022-04-09 NOTE — Progress Notes (Signed)
?  Subjective:  ?Patient ID: Gwendolyn Perez, female    DOB: 1946-03-13,  MRN: [id number] ? ?Chief Complaint  ?Patient presents with  ? Nail Problem  ?    L inflammed, puffy, painful  ? ? ?76 y.o. female presents with the above complaint. History confirmed with patient.  Her left third toe continues to worsen she had removal of the nail and it has been getting worse and is very painful continues to be swollen and red.. ? ?Objective:  ?Physical Exam: ?no trophic changes or ulcerative lesions, normal DP and PT pulses, normal sensory exam, and toes are warm she has slightly sluggish capillary refill but no cyanosis or pallor, the left third toe is an eschar where the nailbed is been removed there is erythema to the IPJ. ? ? ? ? ?Radiographs: ?Multiple views x-ray of the left foot: No definitive lucency erosion or osteolysis in the distal third phalanx ?Assessment:  ? ?1. Toe ulcer, left, with unspecified severity ([place])   ? ? ? ?Plan:  ?Patient was evaluated and treated and all questions answered. ? ?I reviewed her clinical progress.  I took new x-rays today and there is no evidence of osteomyelitis or deep infection.  She does have some sluggish capillary fill time but no cyanosis or pallor and I do not think she meets the full criteria of Raynaud's syndrome.  She does say her feet do get cold and she feels better when her foot is in a warm soak.  Advised to continue warm water soaks but I would avoid Epsom salt which I think is likely drying out the eschar at this point and she may have had a reaction and rash to Betadine.  I expanded her antibiotics to Augmentin for better Streptococcus and anaerobic coverage.  I also discussed wound care for the area and prescribed her nitroglycerin paste to apply proximal to the toe to see if this improves vasodilation and circulation through the toe, advised to keep covered with bandage and mupirocin ointment and change twice daily.  She has an upcoming appoint with Dr. Paulla Dolly next week  and I discussed with her I will evaluate her along with Dr. Milinda Pointer and we will decide on further treatment. ? ?No follow-ups on file.  ? ?

## 2022-04-11 ENCOUNTER — Ambulatory Visit: Payer: Medicare Other | Admitting: Podiatry

## 2022-04-11 ENCOUNTER — Encounter: Payer: Self-pay | Admitting: Podiatry

## 2022-04-11 DIAGNOSIS — L03032 Cellulitis of left toe: Secondary | ICD-10-CM | POA: Diagnosis not present

## 2022-04-11 NOTE — Progress Notes (Signed)
**Note Gwendolyn-Identified via Obfuscation** Subjective:  ? ?Patient ID: Gwendolyn Perez, female   DOB: 76 y.o.   MRN: [id number]  ? ?HPI ?Patient presents concerned about a red distal third toe left and saw Dr. Sherryle Lis just in the last several days.  States that its not really been sore but she is concerned about the look of it and had a toenail removed by Dr. Milinda Pointer several weeks ago ? ? ?ROS ? ? ?   ?Objective:  ?Physical Exam  ?Neurovascular status intact with patient found to have a crusted eschar area on the dorsal of the left third nailbed with previous surgery was done with redness that extends to the inner phalangeal joint but light in color with minimal discomfort.  Patient is wearing a sandal and is currently on Augmentin ? ?   ?Assessment:  ?Eschar formation digit 3 left with x-ray taken several days ago which does not indicate osteolysis or indications of bone infection with mild redness of the distal joint of the left third toe ? ?   ?Plan:  ?Reviewed with her this condition and tried to allay her fears I do think will heal uneventfully have advised on more air for this and wearing a cover bandage when she is out and about.  Patient should heal uneventfully but needs to keep an eye on it and will see Dr. Milinda Pointer back in the next several weeks and if it still red will require further x-ray or further diagnostic testing.  Very confident this will heal but cannot give her complete reassurance of this at this current time ?   ? ? ?

## 2022-04-18 ENCOUNTER — Ambulatory Visit: Payer: Medicare Other | Admitting: Podiatry

## 2022-04-18 DIAGNOSIS — L03032 Cellulitis of left toe: Secondary | ICD-10-CM

## 2022-04-19 DIAGNOSIS — E559 Vitamin D deficiency, unspecified: Secondary | ICD-10-CM | POA: Diagnosis not present

## 2022-04-19 DIAGNOSIS — R5381 Other malaise: Secondary | ICD-10-CM | POA: Diagnosis not present

## 2022-04-19 DIAGNOSIS — E039 Hypothyroidism, unspecified: Secondary | ICD-10-CM | POA: Diagnosis not present

## 2022-04-21 NOTE — Progress Notes (Signed)
She presents today to see me back for a slow healing nail avulsion third digit left foot. ? ?Objective: Vital signs are stable she is alert oriented x3 she continues to put the medication which is nitroglycerin cream to the dorsum of the foot she is trying to keep this dry and clean at all times. ? ?Assessment: This appears to be doing much better than it was the last time I saw it well-healing wound. ? ?Plan: She can continue current medications and follow-up with me in a week or so. ?

## 2022-04-25 ENCOUNTER — Ambulatory Visit: Payer: Medicare Other | Admitting: Podiatry

## 2022-05-15 DIAGNOSIS — H25813 Combined forms of age-related cataract, bilateral: Secondary | ICD-10-CM | POA: Diagnosis not present

## 2022-05-15 DIAGNOSIS — H5203 Hypermetropia, bilateral: Secondary | ICD-10-CM | POA: Diagnosis not present

## 2022-06-13 DIAGNOSIS — L820 Inflamed seborrheic keratosis: Secondary | ICD-10-CM | POA: Diagnosis not present

## 2022-06-13 DIAGNOSIS — D225 Melanocytic nevi of trunk: Secondary | ICD-10-CM | POA: Diagnosis not present

## 2022-06-13 DIAGNOSIS — I788 Other diseases of capillaries: Secondary | ICD-10-CM | POA: Diagnosis not present

## 2022-06-13 DIAGNOSIS — D1801 Hemangioma of skin and subcutaneous tissue: Secondary | ICD-10-CM | POA: Diagnosis not present

## 2022-06-13 DIAGNOSIS — L720 Epidermal cyst: Secondary | ICD-10-CM | POA: Diagnosis not present

## 2022-06-13 DIAGNOSIS — L570 Actinic keratosis: Secondary | ICD-10-CM | POA: Diagnosis not present

## 2022-06-13 DIAGNOSIS — L298 Other pruritus: Secondary | ICD-10-CM | POA: Diagnosis not present

## 2022-06-13 DIAGNOSIS — L821 Other seborrheic keratosis: Secondary | ICD-10-CM | POA: Diagnosis not present

## 2022-06-13 DIAGNOSIS — L814 Other melanin hyperpigmentation: Secondary | ICD-10-CM | POA: Diagnosis not present

## 2022-07-23 ENCOUNTER — Ambulatory Visit: Payer: Medicare Other | Admitting: Podiatry

## 2022-07-23 ENCOUNTER — Encounter: Payer: Self-pay | Admitting: Podiatry

## 2022-07-23 DIAGNOSIS — L299 Pruritus, unspecified: Secondary | ICD-10-CM | POA: Diagnosis not present

## 2022-07-23 NOTE — Progress Notes (Signed)
She presents today after having not seen her for a couple months with chief complaint of plantar bilateral itching.  She states that she is seen her dermatologist who put her on topical antifungal and it stopped the itch however the itch is returned and she has done this several times.  Each time is stop the itch but returned quicker.  She states that it really never did stop the itch when we were using the terbinafine trying to save her third toenail on her left foot.  Objective: Vital signs are stable she is alert and oriented x3.  Pulses are palpable.  Neurologic sensorium is intact Deetjen reflexes are intact muscle strength is normal symmetrical.  Today's evaluation was doing some well-hydrated use no erythema edema cellulitis drainage or odor.  Assessment: Based on history and oral therapy not alleviated her symptoms other than trying another oral therapy I feel that this very well could be an early neuropathy.  Plan: We discussed gabapentin however she would like to try topicals first and I gave her several recommendations.  I will follow-up with her on an as-needed basis or she will call and request the gabapentin.  If she calls to request gabapentin we will fill it 100 mg 1 p.o. nightly #30 then she will follow-up with me.

## 2022-07-29 ENCOUNTER — Encounter: Payer: Self-pay | Admitting: Podiatry

## 2022-07-29 MED ORDER — GABAPENTIN 100 MG PO CAPS: 100.0000 mg | ORAL_CAPSULE | Freq: Every day | ORAL | 1 refills | Status: DC

## 2022-09-30 ENCOUNTER — Other Ambulatory Visit: Payer: Self-pay | Admitting: Podiatry

## 2022-10-01 DIAGNOSIS — L299 Pruritus, unspecified: Secondary | ICD-10-CM | POA: Diagnosis not present

## 2022-10-01 DIAGNOSIS — Z9181 History of falling: Secondary | ICD-10-CM | POA: Diagnosis not present

## 2022-10-01 DIAGNOSIS — E042 Nontoxic multinodular goiter: Secondary | ICD-10-CM | POA: Diagnosis not present

## 2022-10-01 DIAGNOSIS — E039 Hypothyroidism, unspecified: Secondary | ICD-10-CM | POA: Diagnosis not present

## 2022-10-04 ENCOUNTER — Telehealth: Payer: Self-pay | Admitting: Podiatry

## 2022-10-04 MED ORDER — GABAPENTIN 100 MG PO CAPS: 100.0000 mg | ORAL_CAPSULE | Freq: Three times a day (TID) | ORAL | 3 refills | Status: DC

## 2022-10-04 NOTE — Telephone Encounter (Signed)
sent 

## 2022-10-04 NOTE — Telephone Encounter (Signed)
  Pt called asking for a refill of her gabapentin. Pt said she went to the pharmacy and it still wasn't there.  Please advise

## 2022-11-13 ENCOUNTER — Encounter: Payer: Self-pay | Admitting: Internal Medicine

## 2022-11-14 ENCOUNTER — Ambulatory Visit (INDEPENDENT_AMBULATORY_CARE_PROVIDER_SITE_OTHER): Payer: Medicare Other | Admitting: Internal Medicine

## 2022-11-14 VITALS — BP 107/64 | HR 69 | Temp 98.0°F | Ht 64.0 in | Wt 154.6 lb

## 2022-11-14 DIAGNOSIS — M2170 Unequal limb length (acquired), unspecified site: Secondary | ICD-10-CM | POA: Diagnosis not present

## 2022-11-14 DIAGNOSIS — E042 Nontoxic multinodular goiter: Secondary | ICD-10-CM

## 2022-11-14 DIAGNOSIS — K219 Gastro-esophageal reflux disease without esophagitis: Secondary | ICD-10-CM

## 2022-11-14 DIAGNOSIS — E063 Autoimmune thyroiditis: Secondary | ICD-10-CM | POA: Diagnosis not present

## 2022-11-14 DIAGNOSIS — E7800 Pure hypercholesterolemia, unspecified: Secondary | ICD-10-CM

## 2022-11-14 DIAGNOSIS — R296 Repeated falls: Secondary | ICD-10-CM | POA: Diagnosis not present

## 2022-11-14 DIAGNOSIS — M25551 Pain in right hip: Secondary | ICD-10-CM | POA: Diagnosis not present

## 2022-11-14 DIAGNOSIS — Z780 Asymptomatic menopausal state: Secondary | ICD-10-CM

## 2022-11-14 DIAGNOSIS — Z0000 Encounter for general adult medical examination without abnormal findings: Secondary | ICD-10-CM

## 2022-11-14 DIAGNOSIS — H9190 Unspecified hearing loss, unspecified ear: Secondary | ICD-10-CM

## 2022-11-14 DIAGNOSIS — K648 Other hemorrhoids: Secondary | ICD-10-CM | POA: Diagnosis not present

## 2022-11-14 DIAGNOSIS — B002 Herpesviral gingivostomatitis and pharyngotonsillitis: Secondary | ICD-10-CM

## 2022-11-14 DIAGNOSIS — Z7189 Other specified counseling: Secondary | ICD-10-CM

## 2022-11-14 DIAGNOSIS — K449 Diaphragmatic hernia without obstruction or gangrene: Secondary | ICD-10-CM

## 2022-11-14 DIAGNOSIS — M8589 Other specified disorders of bone density and structure, multiple sites: Secondary | ICD-10-CM

## 2022-11-14 DIAGNOSIS — H6120 Impacted cerumen, unspecified ear: Secondary | ICD-10-CM

## 2022-11-14 DIAGNOSIS — H903 Sensorineural hearing loss, bilateral: Secondary | ICD-10-CM | POA: Insufficient documentation

## 2022-11-14 DIAGNOSIS — M19049 Primary osteoarthritis, unspecified hand: Secondary | ICD-10-CM

## 2022-11-14 HISTORY — DX: Other hemorrhoids: K64.8

## 2022-11-14 NOTE — Progress Notes (Signed)
76 yo Gwendolyn Perez, at [facility],  is establishing care in [facility] today, transferring from Dr. Yaakov Guthrie at [facility] upon his retirement.  She is accompanied by her husband who is also establishing here.   Mr. And Mrs. Sherbert are retired from the [employer] and moved a few years ago from [place] to [place].  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  She has enjoyed good health and ha very few chronic medical conditions, though arthritis pain is a daily issue, her feet often feel itchy, she experiences bloated sensation, and sometimes awakens with numb feeling in one or the other arms.  She would like to support with weight loss and healthy lifestyle.  Today was a largely get acquainted visit during which we reviewed her health history and her health care goals.  I had received and reviewed faxed medical records from Dr. Jodi Mourning office.    Allergies  Allergen Reactions   Black Cohosh Rash   Sulfa Antibiotics Rash    Past Medical History:  Diagnosis Date   Arthritis 10 yrs ago   Thumbs,coxic, neck, hip, knees   GERD (gastroesophageal reflux disease) March 2020   Hiatal hernia   Heart murmur Birth   Functional heart murmur   Hypercholesterolemia 02/20/2022   Obstructive sleep apnea (adult) (pediatric) 02/20/2022   Thyroid disease Hypothyroidism   Mildly affected   Current Outpatient Medications  Medication Instructions   acetaminophen (TYLENOL) 500 MG tablet 2 tablets as needed   estradiol (ESTRACE) 0.1 MG/GM vaginal cream 1 Applicatorful, Vaginal, Daily   gabapentin (NEURONTIN) 100 mg, Oral, Daily at bedtime   levothyroxine (SYNTHROID) 25 MCG tablet No dose, route, or frequency recorded.   omeprazole (PRILOSEC) 40 mg, Oral, Every morning   PFIZER COVID-19 VAC BIVALENT injection No dose, route, or frequency recorded.   valACYclovir (VALTREX) 1000 MG  tablet 1/2 tablet    BP 107/64 (BP Location: Right Arm, Patient Position: Sitting, Cuff Size: Small)   Pulse 69   Temp 98 F (36.7 C) (Oral)   Ht '5\' 4"'$  (1.626 m)   Wt 154 lb 9.6 oz (70.1 kg)   SpO2 98%   BMI 26.54 kg/m Youthful woman with contagious energy, a delight to visit with.  Hands with Heberden's nodes.  No formal exam this visit.   Assessment and plan:  [name] is establishing care here and will be looking forward to a guided wellness plan focusing on function, weight loss, and minimization of arthritis pain.  Today's visit was focused on reviewing history and at next visit we'll discuss options for treatment plans going forward.  Problems listed below are not in any particular order.  Problems updated under "overview" do not populate into this note but are visible in medical record.  Multiple falls in March 2023 [place] once when bending forward and down, second fell out of shower, third upon standing up.  No explanation - she didn't lose consciousness, didn't have antecedent dizziness.  Going back to see Dr. Mina Marble in January.    Hypercholesterolemia [facility] 02/2021:  TC 267, LDL 159, HDL 56.  Not on treatment.  Will discuss next visit.  Goals of care, counseling/discussion Mrs. Bettinger and her husband have been proactively planning for their future, speaking to attorney and Music therapist, and anticipating eventually moving into a care facility in [place].  She desires a function-focused  approach to her health care, and like her husband wishes to avoid medication whenever safely possible.  Immediate goals are actively embracing healthy lifestyle toward weight loss and increased energy and comfort (with regard to arthritis pain).    Healthcare maintenance She is changing health care systems and usual maintenance activities do not populate in EPIC. She is UTD on all vaccines except Prevnar 20 (will receive today) and TDaP (will receive at pharmacy), UTD on DEXA and  mammography. At next visit I"ll f/u on colon ca screening hx.   Osteoarthritis, localized, hand, unspecified laterality Multiple finger joints affected by stiffness; Heberden's nodes present.  Discuss treatment plan next visit.   RTC 12/2022

## 2022-11-14 NOTE — Assessment & Plan Note (Signed)
Fell once when bending forward and down, second fell out of shower, third upon standing up.  No explanation - she didn't lose consciousness, didn't have antecedent dizziness.  Going back to see Dr. Mina Marble in January.

## 2022-11-14 NOTE — Patient Instructions (Signed)
Gwendolyn Perez,  It was such a pleasure to meet with you today!  I enjoyed our discussion and look forward to working with you together on your health care goals.  Today's visit was spent primarily on updating her medical record and getting acquainted.  Will get together again in early February to identify a care plan.  In the meantime, it will be helpful to learn more information from your neurologist who you will see in January.  Take care and stay well, Dr. Jimmye Norman

## 2022-11-15 ENCOUNTER — Encounter: Payer: Medicare Other | Admitting: Internal Medicine

## 2022-11-22 ENCOUNTER — Encounter: Payer: Self-pay | Admitting: Internal Medicine

## 2022-11-22 DIAGNOSIS — Z0000 Encounter for general adult medical examination without abnormal findings: Secondary | ICD-10-CM | POA: Insufficient documentation

## 2022-11-22 DIAGNOSIS — Z7189 Other specified counseling: Secondary | ICD-10-CM | POA: Insufficient documentation

## 2022-11-22 HISTORY — DX: Encounter for general adult medical examination without abnormal findings: Z00.00

## 2022-11-22 NOTE — Assessment & Plan Note (Addendum)
[facility] 02/2021:  TC 267, LDL 159, HDL 56.  Not on treatment.  Will discuss next visit.

## 2022-11-22 NOTE — Assessment & Plan Note (Signed)
Multiple finger joints affected by stiffness; Heberden's nodes present.  Discuss treatment plan next visit.

## 2022-11-22 NOTE — Assessment & Plan Note (Signed)
She is changing health care systems and usual maintenance activities do not populate in EPIC. She is UTD on all vaccines except Prevnar 20 (will receive today) and TDaP (will receive at pharmacy), UTD on DEXA and mammography. At next visit I"ll f/u on colon ca screening hx.

## 2022-11-22 NOTE — Assessment & Plan Note (Signed)
Gwendolyn Perez and her husband have been proactively planning for their future, speaking to attorney and financial advisor, and anticipating eventually moving into a care facility in [place].  She desires a function-focused approach to her health care, and like her husband wishes to avoid medication whenever safely possible.  Immediate goals are actively embracing healthy lifestyle toward weight loss and increased energy and comfort (with regard to arthritis pain).

## 2022-11-27 ENCOUNTER — Telehealth: Payer: Self-pay | Admitting: *Deleted

## 2022-11-27 NOTE — Telephone Encounter (Signed)
Patient is scheduled to come in tomorrow 11/28/2022 '@3'$ :20 with Dr.Carter for a ear irrigation.

## 2022-11-27 NOTE — Telephone Encounter (Signed)
Patient called in requesting ear irrigation for wax build up. States the wax was too hard at OV on 12/7 for irrigation. States she can take Debrox the night before an appt.

## 2022-11-28 ENCOUNTER — Ambulatory Visit (INDEPENDENT_AMBULATORY_CARE_PROVIDER_SITE_OTHER): Payer: Medicare Other | Admitting: Student

## 2022-11-28 ENCOUNTER — Other Ambulatory Visit: Payer: Self-pay

## 2022-11-28 ENCOUNTER — Encounter: Payer: Self-pay | Admitting: Student

## 2022-11-28 VITALS — BP 110/64 | HR 78 | Temp 97.6°F | Ht 64.0 in | Wt 158.3 lb

## 2022-11-28 DIAGNOSIS — H6123 Impacted cerumen, bilateral: Secondary | ICD-10-CM | POA: Diagnosis not present

## 2022-12-02 NOTE — Progress Notes (Signed)
Patient recently seen in clinic 12/7. At that time she had her ears irrigated. She became uncomfortable with this and the plan was to come in for a nurse only visit for ear irrigation. Patient noted to have cerumen in ear canal. Ear was irrigated by our nursing team and a small amount of cerumen was removed with curette by resident team.   Patient will continue debrox drops and return to clinic as she did develop small amount of irritation with curette use.

## 2022-12-03 NOTE — Assessment & Plan Note (Signed)
Underwent ear irrigation 12/7 and 12/21 with nursing team. She also had a small amount of cerumen removed with curette by resident team. Will continue debrox drops and return to clinic for removal of rest of ear wax as she experienced some external ear canal irritation with curette use.

## 2022-12-05 NOTE — Progress Notes (Signed)
Internal Medicine Clinic Attending  Case discussed with Dr. Carter  At the time of the visit.  We reviewed the resident's history and exam and pertinent patient test results.  I agree with the assessment, diagnosis, and plan of care documented in the resident's note.  

## 2022-12-10 ENCOUNTER — Ambulatory Visit (INDEPENDENT_AMBULATORY_CARE_PROVIDER_SITE_OTHER): Payer: Medicare Other | Admitting: Student

## 2022-12-10 ENCOUNTER — Other Ambulatory Visit: Payer: Self-pay

## 2022-12-10 VITALS — Ht 64.0 in | Wt 159.8 lb

## 2022-12-10 DIAGNOSIS — H9190 Unspecified hearing loss, unspecified ear: Secondary | ICD-10-CM | POA: Diagnosis not present

## 2022-12-10 NOTE — Patient Instructions (Signed)
For your ear wax, please use the debrox drops when you feel like you are starting to get buildup. Then please schedule an appointment with [place] for ear irrigation about three days later.    We will request records from ENT and your audiologist from 04/2021. Please request the records from your next audiology appointment and have that sent to [place].

## 2022-12-12 NOTE — Assessment & Plan Note (Signed)
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 Patient has had minimal improvement in her symptoms with ear irrigation and after today's visit she has minimal cerumen in the bilateral ears.   She will get new hearing aids in 2 weeks when she sees her audiologist. She will follow up with her PCP in 1 month regarding this.

## 2022-12-12 NOTE — Assessment & Plan Note (Signed)
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 Patient has had minimal improvement in her symptoms with ear irrigation and after today's visit she has minimal cerumen in the bilateral ears.   She will get new hearing aids in 2 weeks when she sees her audiologist. She will follow up with her PCP in 1 month regarding this.

## 2022-12-12 NOTE — Progress Notes (Signed)
   CC: F/u regarding impacted cerumen and hearing Perez.   HPI:  Ms.Charlesetta Dickard is a 77 y.o. F with PMH per below who presents for follow up of her impacted cerumen and progressively worsening hearing Perez.   Patient states that 12 years ago when she retired she had a hearing test that was normal and noted that her hearing was fine. About ten years ago she started having some hearing Perez that was mild. 4-5 years ago it worsened. She states that she mostly has trouble hearing high pitched sounds and usually does not struggle with lower pitched sounds. She does not recall any preceding illnesses. She last saw audiology 04/2021. She states that her hearing has become so poor and consequently distressing that she sometimes cries. She notes she does have an appointment with her audiologist coming up for possible changing of her hearing aids.     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  Ears: Bony structures appreciated in bilateral ears with minimal cerumen in L ear. S/p irrigation minimal cerumen in R ear. No bleeding of external canal or erythema. No fluid behind TM  Neck: Normal range of motion.  Cardiovascular: Normal rate, regular rhythm, intact distal pulses. No gallop and no friction rub.  No murmur heard. No lower extremity edema  Pulmonary: Non labored breathing on room air, no  wheezing or rales  Abdominal: Soft. Normal bowel sounds. Non distended and non tender Musculoskeletal: Normal range of motion.        General: No tenderness or edema.  Neurological: Alert and oriented to person, place, and time. Non focal  Skin: Skin is warm and dry.    Assessment & Plan:   See Encounters Tab for problem based charting.  Patient discussed with Dr. Jimmye Norman

## 2022-12-16 NOTE — Progress Notes (Signed)
Internal Medicine Clinic Attending  Case discussed with Dr. Carter  At the time of the visit.  We reviewed the resident's history and exam and pertinent patient test results.  I agree with the assessment, diagnosis, and plan of care documented in the resident's note.  

## 2022-12-26 ENCOUNTER — Encounter: Payer: Self-pay | Admitting: Neurology

## 2022-12-26 ENCOUNTER — Ambulatory Visit: Payer: Medicare Other | Admitting: Neurology

## 2022-12-26 ENCOUNTER — Telehealth: Payer: Self-pay | Admitting: Neurology

## 2022-12-26 VITALS — BP 125/78 | HR 71 | Ht 64.0 in | Wt 156.0 lb

## 2022-12-26 DIAGNOSIS — M542 Cervicalgia: Secondary | ICD-10-CM | POA: Diagnosis not present

## 2022-12-26 DIAGNOSIS — M545 Low back pain, unspecified: Secondary | ICD-10-CM | POA: Insufficient documentation

## 2022-12-26 DIAGNOSIS — R202 Paresthesia of skin: Secondary | ICD-10-CM

## 2022-12-26 DIAGNOSIS — R0683 Snoring: Secondary | ICD-10-CM | POA: Diagnosis not present

## 2022-12-26 DIAGNOSIS — R7309 Other abnormal glucose: Secondary | ICD-10-CM | POA: Diagnosis not present

## 2022-12-26 DIAGNOSIS — R799 Abnormal finding of blood chemistry, unspecified: Secondary | ICD-10-CM | POA: Diagnosis not present

## 2022-12-26 MED ORDER — DULOXETINE HCL 30 MG PO CPEP: 30.0000 mg | ORAL_CAPSULE | Freq: Every day | ORAL | 0 refills | Status: DC

## 2022-12-26 MED ORDER — DULOXETINE HCL 60 MG PO CPEP: 60.0000 mg | ORAL_CAPSULE | Freq: Every day | ORAL | 3 refills | Status: DC

## 2022-12-26 NOTE — Progress Notes (Signed)
Chief Complaint  Patient presents with   New Patient (Initial Visit)    Rm 15, with husband  [employer]/ C/o numbness, tingling and burning in both legs, sx started a few years ago       [name] is a 77 y.o. female  Neck pain, radiating pain to bilateral shoulder and upper extremity Left low back pain, radiating pain to left lower extremity Bilateral lower extremity paresthesia, urged to move,  MRI of cervical, lumbar spine to rule out cervical, lumbar radiculopathy  Differentiation diagnoses also include fibromyalgia, restless leg syndrome,  Laboratory evaluation including ferritin, inflammatory markers  Add on Cymbalta 30 mg, titrating to 60 mg daily  At risk for obstructive sleep apnea  Referral to sleep study  DIAGNOSTIC DATA (LABS, IMAGING, TESTING) - I reviewed patient records, labs, notes, testing and imaging myself where available.   MEDICAL HISTORY:  Gwendolyn Perez is a 77 year old female, accompanied by her husband, and daughter Gwendolyn Perez, seen in request by her primary care doctor Dr. Jacelyn Grip, [place] for episode of falling, initial evaluation was on March 12, 2022  I reviewed and summarized the referring note. PMHX. 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, when she just came back from a call, she received another call, in a standing up position tried to put on her pants, she fell backwards,  She complains of lower back pain from impact, but  otherwise denies sensory loss of bilateral upper or lower extremity, denies weakness, denies bowel and bladder incontinence  She denies warning signs prior to falling, no chest pain, no heart palpitation, denied loss of consciousness,  There is no orthostatic blood pressure change on today's examination,  UPDATE Dec 26 2022: She is accompanied by her husband at today's clinical visit, has no recurrent falling episode, 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 [employer], denied difficulty walking, but over the past 6 months, she has frequent neck pain, wake up in the morning often feel neck muscle tension, if you lie on the left side, noticed radiating pain, paresthesia to left upper extremity and left hand, happen to the right side if she lie on the right side  Husband also reported that she has a lot of loud snoring, she complains of diffuse body achy pain, extreme fatigue during the day, she denies bowel or bladder incontinence  Also has left lower back pain, radiating pain to left lower extremity,  She has many years history of body tenderness upon deep palpitation, previously carried a diagnosis of restless leg syndrome, urged to move her leg, continue to have bilateral feet numbing, tingling itching sensation, urged to move her leg while lying still  PHYSICAL EXAM:   Vitals:   12/26/22 0831  BP: 125/78  Pulse: 71  Weight: 156 lb (70.8 kg)  Height: '5\' 4"'$  (1.626 m)   Body mass index is 26.78 kg/m.  PHYSICAL EXAMNIATION:  Gen: NAD, conversant, well nourised, well groomed  Cardiovascular: Regular rate rhythm, no peripheral edema, warm, nontender. Eyes: Conjunctivae clear without exudates or hemorrhage Neck: Supple, no carotid bruits. Pulmonary: Clear to auscultation bilaterally   NEUROLOGICAL EXAM:  MENTAL STATUS: Speech:    Speech is normal; fluent and spontaneous with normal comprehension.  Cognition:     Orientation to time, place and person     Normal recent  and remote memory     Normal Attention span and concentration     Normal Language, naming, repeating,spontaneous speech     Fund of knowledge   CRANIAL NERVES: CN II: Visual fields are full to confrontation. Pupils are round equal and briskly reactive to light. CN III, IV, VI: extraocular movement are normal. No ptosis. CN V: Facial sensation is intact to light touch CN VII: Face is symmetric with normal eye closure  CN VIII: Hearing is normal to causal conversation. CN IX, X: Phonation is normal. CN XI: Head turning and shoulder shrug are intact  MOTOR: There is no pronator drift of out-stretched arms. Muscle bulk and tone are normal. Muscle strength is normal.  REFLEXES: Reflexes are 2+ and symmetric at the biceps, triceps, knees, and ankles. Plantar responses are flexor.  SENSORY: Intact to light touch, pinprick and vibratory sensation are intact in fingers and toes.  COORDINATION: There is no trunk or limb dysmetria noted.  GAIT/STANCE: Posture is normal. Gait is steady with normal steps, base, arm swing, and turning. Heel and toe walking are normal.  Slight difficulty with tandem walking is expected with her age.  REVIEW OF SYSTEMS:  Full 14 system review of systems performed and notable only for as above All other review of systems were negative.   ALLERGIES: Allergies  Allergen Reactions   Black Cohosh Rash   Sulfa Antibiotics Rash    HOME MEDICATIONS: Current Outpatient Medications  Medication Sig Dispense Refill   acetaminophen (TYLENOL) 500 MG tablet 2 tablets as needed     estradiol (ESTRACE) 0.1 MG/GM vaginal cream Place 1 Applicatorful vaginally daily.     gabapentin (NEURONTIN) 100 MG capsule TAKE 1 CAPSULE BY MOUTH AT BEDTIME. 30 capsule 1   levothyroxine (SYNTHROID) 25 MCG tablet      omeprazole (PRILOSEC) 40 MG capsule Take 40 mg by mouth every morning.     PFIZER COVID-19 VAC BIVALENT injection      valACYclovir (VALTREX) 1000 MG tablet 1/2 tablet      No current facility-administered medications for this visit.    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,coxic, neck, hip, knees   GERD (gastroesophageal reflux disease) March 2020   Hiatal hernia   Healthcare maintenance 11/22/2022   Heart murmur Birth   Functional heart murmur   Hypercholesterolemia 02/20/2022   Obstructive sleep apnea (adult) (pediatric) 02/20/2022   Thyroid disease Hypothyroidism   Mildly affected    PAST SURGICAL HISTORY: Past Surgical History:  Procedure Laterality Date   ABDOMINAL HYSTERECTOMY  2000   By laparoscopic surgery    FAMILY HISTORY: Family History  Problem Relation Age of Onset   Healthy Mother    Cancer Father    Hearing loss Paternal Grandfather    Arthritis Paternal Grandmother    Arthritis Paternal Aunt    Hearing loss Paternal Aunt     SOCIAL HISTORY: Social History   Socioeconomic History   Marital status: Married    Spouse name: Not on file   Number of children: Not on file   Years  of education: Not on file   Highest education level: Not on file  Occupational History   Not on file  Tobacco Use   Smoking status: Never    Passive exposure: Never   Smokeless tobacco: Never  Vaping Use   Vaping Use: Never used  Substance and Sexual Activity   Alcohol use: Yes    Alcohol/week: 2.0 standard drinks of alcohol    Types: 2 Glasses of wine per week    Comment: Socially when with friends   Drug use: Never   Sexual activity: Yes    Comment: Hysterectomy  Other Topics Concern   Not on file  Social History Narrative   Married, 2 children.  Never smoker.  Occasional alcohol beverage.     Social Determinants of Health   Financial Resource Strain: Low Risk  (11/28/2022)   Overall Financial Resource Strain (CARDIA)    Difficulty of Paying Living Expenses: Not hard at all  Food Insecurity: No Food Insecurity (12/10/2022)   Hunger Vital Sign    Worried About Running Out  of Food in the Last Year: Never true    Ran Out of Food in the Last Year: Never true  Transportation Needs: No Transportation Needs (12/10/2022)   PRAPARE - Hydrologist (Medical): No    Lack of Transportation (Non-Medical): No  Physical Activity: Inactive (11/28/2022)   Exercise Vital Sign    Days of Exercise per Week: 0 days    Minutes of Exercise per Session: 0 min  Stress: No Stress Concern Present (11/28/2022)   [place]    Feeling of Stress : Not at all  Social Connections: Moderately Isolated (12/10/2022)   Social Connection and Isolation Panel [NHANES]    Frequency of Communication with Friends and Family: More than three times a week    Frequency of Social Gatherings with Friends and Family: More than three times a week    Attends Religious Services: Never    Marine scientist or Organizations: No    Attends Archivist Meetings: Never    Marital Status: Married  Human resources officer Violence: Not At Risk (12/10/2022)   Humiliation, Afraid, Rape, and Kick questionnaire    Fear of Current or Ex-Partner: No    Emotionally Abused: No    Physically Abused: No    Sexually Abused: No      Marcial Pacas, M.D. Ph.D.  [facility] [address] [place], [place] [address] Ph: [phone] Fax: [phone]  CC:  Delrae Rend, MD [address],  [place] [address]  Angelica Pou, MD

## 2022-12-26 NOTE — Telephone Encounter (Signed)
[employer] medicare NPR sent to [facility] [phone]

## 2022-12-30 LAB — CBC WITH DIFFERENTIAL/PLATELET
Basophils Absolute: 0 10*3/uL (ref 0.0–0.2)
Basos: 1 %
EOS (ABSOLUTE): 0.2 10*3/uL (ref 0.0–0.4)
Eos: 3 %
Hematocrit: 42.7 % (ref 34.0–46.6)
Hemoglobin: 14.7 g/dL (ref 11.1–15.9)
Immature Grans (Abs): 0 10*3/uL (ref 0.0–0.1)
Immature Granulocytes: 0 %
Lymphocytes Absolute: 1.6 10*3/uL (ref 0.7–3.1)
Lymphs: 32 %
MCH: 31.8 pg (ref 26.6–33.0)
MCHC: 34.4 g/dL (ref 31.5–35.7)
MCV: 92 fL (ref 79–97)
Monocytes Absolute: 0.4 10*3/uL (ref 0.1–0.9)
Monocytes: 9 %
Neutrophils Absolute: 2.7 10*3/uL (ref 1.4–7.0)
Neutrophils: 55 %
Platelets: 303 10*3/uL (ref 150–450)
RBC: 4.62 x10E6/uL (ref 3.77–5.28)
RDW: 12.2 % (ref 11.7–15.4)
WBC: 5 10*3/uL (ref 3.4–10.8)

## 2022-12-30 LAB — RPR: RPR Ser Ql: NONREACTIVE

## 2022-12-30 LAB — COMPREHENSIVE METABOLIC PANEL
ALT: 23 IU/L (ref 0–32)
AST: 19 IU/L (ref 0–40)
Albumin/Globulin Ratio: 2.1 (ref 1.2–2.2)
Albumin: 4.6 g/dL (ref 3.8–4.8)
Alkaline Phosphatase: 115 IU/L (ref 44–121)
BUN/Creatinine Ratio: 18 (ref 12–28)
BUN: 18 mg/dL (ref 8–27)
Bilirubin Total: 0.3 mg/dL (ref 0.0–1.2)
CO2: 25 mmol/L (ref 20–29)
Calcium: 10.6 mg/dL — ABNORMAL HIGH (ref 8.7–10.3)
Chloride: 103 mmol/L (ref 96–106)
Creatinine, Ser: 0.99 mg/dL (ref 0.57–1.00)
Globulin, Total: 2.2 g/dL (ref 1.5–4.5)
Glucose: 77 mg/dL (ref 70–99)
Potassium: 4.6 mmol/L (ref 3.5–5.2)
Sodium: 143 mmol/L (ref 134–144)
Total Protein: 6.8 g/dL (ref 6.0–8.5)
eGFR: 60 mL/min/{1.73_m2} (ref >59)

## 2022-12-30 LAB — MULTIPLE MYELOMA PANEL, SERUM
Albumin SerPl Elph-Mcnc: 4.1 g/dL (ref 2.9–4.4)
Albumin/Glob SerPl: 1.6 (ref 0.7–1.7)
Alpha 1: 0.2 g/dL (ref 0.0–0.4)
Alpha2 Glob SerPl Elph-Mcnc: 0.6 g/dL (ref 0.4–1.0)
B-Globulin SerPl Elph-Mcnc: 1 g/dL (ref 0.7–1.3)
Gamma Glob SerPl Elph-Mcnc: 0.9 g/dL (ref 0.4–1.8)
Globulin, Total: 2.7 g/dL (ref 2.2–3.9)
IgA/Immunoglobulin A, Serum: 136 mg/dL (ref 64–422)
IgG (Immunoglobin G), Serum: 984 mg/dL (ref 586–1602)
IgM (Immunoglobulin M), Srm: 44 mg/dL (ref 26–217)

## 2022-12-30 LAB — HGB A1C W/O EAG: Hgb A1c MFr Bld: 5.7 % — ABNORMAL HIGH (ref 4.8–5.6)

## 2022-12-30 LAB — FERRITIN: Ferritin: 178 ng/mL — ABNORMAL HIGH (ref 15–150)

## 2022-12-30 LAB — SEDIMENTATION RATE: Sed Rate: 2 mm/hr (ref 0–40)

## 2022-12-30 LAB — VITAMIN B12: Vitamin B-12: 1003 pg/mL (ref 232–1245)

## 2022-12-30 LAB — C-REACTIVE PROTEIN: CRP: 3 mg/L (ref 0–10)

## 2022-12-30 LAB — ANA W/REFLEX IF POSITIVE: Anti Nuclear Antibody (ANA): NEGATIVE

## 2022-12-30 LAB — CK: Total CK: 69 U/L (ref 32–182)

## 2022-12-30 LAB — TSH: TSH: 2.5 u[IU]/mL (ref 0.450–4.500)

## 2023-01-09 ENCOUNTER — Ambulatory Visit
Admission: RE | Admit: 2023-01-09 | Discharge: 2023-01-09 | Disposition: A | Discharge status: Home / Self Care | Payer: Medicare Other | Source: Ambulatory Visit | Attending: Neurology | Admitting: Neurology

## 2023-01-09 ENCOUNTER — Ambulatory Visit (INDEPENDENT_AMBULATORY_CARE_PROVIDER_SITE_OTHER): Payer: Medicare Other | Admitting: Internal Medicine

## 2023-01-09 VITALS — BP 121/69 | HR 67 | Temp 98.2°F | Ht 64.0 in | Wt 155.1 lb

## 2023-01-09 DIAGNOSIS — R202 Paresthesia of skin: Secondary | ICD-10-CM

## 2023-01-09 DIAGNOSIS — H60549 Acute eczematoid otitis externa, unspecified ear: Secondary | ICD-10-CM

## 2023-01-09 DIAGNOSIS — M542 Cervicalgia: Secondary | ICD-10-CM | POA: Diagnosis not present

## 2023-01-09 DIAGNOSIS — M25551 Pain in right hip: Secondary | ICD-10-CM

## 2023-01-09 DIAGNOSIS — K219 Gastro-esophageal reflux disease without esophagitis: Secondary | ICD-10-CM

## 2023-01-09 DIAGNOSIS — K449 Diaphragmatic hernia without obstruction or gangrene: Secondary | ICD-10-CM

## 2023-01-09 DIAGNOSIS — M545 Low back pain, unspecified: Secondary | ICD-10-CM | POA: Diagnosis not present

## 2023-01-09 DIAGNOSIS — M5416 Radiculopathy, lumbar region: Secondary | ICD-10-CM | POA: Diagnosis not present

## 2023-01-09 DIAGNOSIS — K648 Other hemorrhoids: Secondary | ICD-10-CM

## 2023-01-09 DIAGNOSIS — B002 Herpesviral gingivostomatitis and pharyngotonsillitis: Secondary | ICD-10-CM

## 2023-01-09 NOTE — Assessment & Plan Note (Signed)
Asymptomatic while on medicine daily in am.

## 2023-01-09 NOTE — Assessment & Plan Note (Signed)
Resolved with cymbalta, unless she works hard.

## 2023-01-09 NOTE — Progress Notes (Signed)
Visit not completed no charge

## 2023-01-13 ENCOUNTER — Encounter: Payer: Self-pay | Admitting: Internal Medicine

## 2023-01-15 MED ORDER — DULOXETINE HCL 30 MG PO CPEP: 30.0000 mg | ORAL_CAPSULE | Freq: Two times a day (BID) | ORAL | 5 refills | Status: DC

## 2023-01-15 MED ORDER — VALACYCLOVIR HCL 1 G PO TABS: 500.0000 mg | ORAL_TABLET | Freq: Every day | ORAL | 5 refills | Status: DC | PRN

## 2023-01-15 MED ORDER — BETAMETHASONE VALERATE 0.1 % EX OINT: 1.0000 | TOPICAL_OINTMENT | Freq: Two times a day (BID) | CUTANEOUS | 0 refills | Status: DC

## 2023-01-30 ENCOUNTER — Ambulatory Visit (INDEPENDENT_AMBULATORY_CARE_PROVIDER_SITE_OTHER): Payer: Medicare Other | Admitting: Neurology

## 2023-01-30 ENCOUNTER — Encounter: Payer: Self-pay | Admitting: Neurology

## 2023-01-30 VITALS — BP 128/76 | HR 64 | Ht 64.0 in | Wt 153.8 lb

## 2023-01-30 DIAGNOSIS — F458 Other somatoform disorders: Secondary | ICD-10-CM

## 2023-01-30 DIAGNOSIS — Z9189 Other specified personal risk factors, not elsewhere classified: Secondary | ICD-10-CM | POA: Diagnosis not present

## 2023-01-30 DIAGNOSIS — R519 Headache, unspecified: Secondary | ICD-10-CM

## 2023-01-30 DIAGNOSIS — G4719 Other hypersomnia: Secondary | ICD-10-CM

## 2023-01-30 DIAGNOSIS — R0683 Snoring: Secondary | ICD-10-CM | POA: Diagnosis not present

## 2023-01-30 DIAGNOSIS — E663 Overweight: Secondary | ICD-10-CM

## 2023-01-30 NOTE — Progress Notes (Signed)
Subjective:    Patient ID: Gwendolyn Perez is a 77 y.o. female.  HPI    Star Age, MD, PhD [facility] [address], [address] [place], [place] [address]  Dear Aliene Beams,   I saw your patient, Gwendolyn Perez, upon your kind request in my sleep clinic today for initial consultation of her sleep disorder, in particular, concern for underlying obstructive sleep apnea.  The patient is accompanied by her  husband today.  As you know, Gwendolyn Perez is a 77 year old female with an underlying medical history of arthritis, neck pain with paresthesias, thyroid disease, reflux disease, history of heart murmur, hyperlipidemia, and mildly overweight state, who reports snoring and excessive daytime somnolence.  Her Epworth sleepiness score is 15 out of 24, fatigue severity score is 11 out of 63.  I reviewed your office note from 12/26/2022.  She reports trouble maintaining sleep.  She lives with her husband, they have no pets in the household, she does use her phone to watch at night and her husband states that she has a sitting on her nightstand and she turns towards the phone to watch something on it until she feels sleepy.  Bedtime is generally around 9:30 PM and rise time around 7 AM.  They have 4 grown children and 10 grandchildren.  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  She does not wake up rested.  She has recurrent morning headaches but no night to night nocturia.  As she recalls, her daughter uses an oral appliance.  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  She drinks caffeine in the form of coffee, 1 or 2 cups in the mornings, typically never after 2 PM.  She does not drink any alcohol, she is a non-smoker.  She had a tonsillectomy at age 54.  She has had rare restless leg symptoms.  Her paresthesias improved with Cymbalta.  Her Past Medical History Is Significant For: Past Medical History:  Diagnosis  Date   Arthritis 10 yrs ago   Thumbs,coxic, neck, hip, knees   GERD (gastroesophageal reflux disease) March 2020   Hiatal hernia   Healthcare maintenance 11/22/2022   Heart murmur Birth   Functional heart murmur   Hypercholesterolemia 02/20/2022   Obstructive sleep apnea (adult) (pediatric) 02/20/2022   Thyroid disease Hypothyroidism   Mildly affected    Her Past Surgical History Is Significant For: Past Surgical History:  Procedure Laterality Date   ABDOMINAL HYSTERECTOMY  2000   By laparoscopic surgery    Her Family History Is Significant For: Family History  Problem Relation Age of Onset   Healthy Mother    Cancer Father    Hearing loss Paternal Grandfather    Arthritis Paternal Grandmother    Arthritis Paternal Aunt    Hearing loss Paternal Aunt     Her Social History Is Significant For: Social History   Socioeconomic History   Marital status: Married    Spouse name: Not on file   Number of children: Not on file   Years of education: Not on file   Highest education level: Not on file  Occupational History   Not on file  Tobacco Use   Smoking status: Never    Passive exposure: Never   Smokeless tobacco: Never  Vaping Use   Vaping Use: Never used  Substance and Sexual Activity   Alcohol use: Yes    Alcohol/week:  2.0 standard drinks of alcohol    Types: 2 Glasses of wine per week    Comment: Socially when with friends   Drug use: Never   Sexual activity: Yes    Comment: Hysterectomy  Other Topics Concern   Not on file  Social History Narrative   Married, 2 children.  Never smoker.  Occasional alcohol beverage.     Social Determinants of Health   Financial Resource Strain: Low Risk  (11/28/2022)   Overall Financial Resource Strain (CARDIA)    Difficulty of Paying Living Expenses: Not hard at all  Food Insecurity: No Food Insecurity (12/10/2022)   Hunger Vital Sign    Worried About Running Out of Food in the Last Year: Never true    Ran Out of Food in  the Last Year: Never true  Transportation Needs: No Transportation Needs (12/10/2022)   PRAPARE - Hydrologist (Medical): No    Lack of Transportation (Non-Medical): No  Physical Activity: Inactive (11/28/2022)   Exercise Vital Sign    Days of Exercise per Week: 0 days    Minutes of Exercise per Session: 0 min  Stress: No Stress Concern Present (11/28/2022)   [place]    Feeling of Stress : Not at all  Social Connections: Moderately Isolated (12/10/2022)   Social Connection and Isolation Panel [NHANES]    Frequency of Communication with Friends and Family: More than three times a week    Frequency of Social Gatherings with Friends and Family: More than three times a week    Attends Religious Services: Never    Marine scientist or Organizations: No    Attends Music therapist: Never    Marital Status: Married    Her Allergies Are:  Allergies  Allergen Reactions   Black Cohosh Rash   Sulfa Antibiotics Rash  :   Her Current Medications Are:  Outpatient Encounter Medications as of 01/30/2023  Medication Sig   acetaminophen (TYLENOL) 500 MG tablet 2 tablets as needed   betamethasone valerate ointment (VALISONE) 0.1 % Apply 1 Application topically 2 (two) times daily.   DULoxetine (CYMBALTA) 30 MG capsule Take 1 capsule (30 mg total) by mouth 2 (two) times daily.   estradiol (ESTRACE) 0.1 MG/GM vaginal cream Place 1 Applicatorful vaginally daily.   levothyroxine (SYNTHROID) 25 MCG tablet    omeprazole (PRILOSEC) 40 MG capsule Take 40 mg by mouth every morning.   PFIZER COVID-19 VAC BIVALENT injection    valACYclovir (VALTREX) 1000 MG tablet Take 0.5 tablets (500 mg total) by mouth daily as needed (herpes stomatitis).   [DISCONTINUED] gabapentin (NEURONTIN) 100 MG capsule TAKE 1 CAPSULE BY MOUTH AT BEDTIME.   No facility-administered encounter medications on file as of  01/30/2023.  :   Review of Systems:  Out of a complete 14 point review of systems, all are reviewed and negative with the exception of these symptoms as listed below:  Review of Systems  Neurological:        Loud snoring, she complains of diffuse body achy pain, extreme fatigue during the day - Last SS more than 7yr.in [place], no treatment,  ESS 15, FSS 11 ,  Noted cymbalta for RLS increased freq, but intensity less.     Objective:  Neurological Exam  Physical Exam Physical Examination:   Vitals:   01/30/23 1025  BP: 128/76  Pulse: 64    General Examination: The patient is a very pleasant  77 y.o. female in no acute distress. She appears well-developed and well-nourished and well groomed.   HEENT: Normocephalic, atraumatic, pupils are equal, round and reactive to light, extraocular tracking is good without limitation to gaze excursion or nystagmus noted. Hearing is grossly intact. Face is symmetric with normal facial animation. Speech is clear with no dysarthria noted. There is no hypophonia. There is no lip, neck/head, jaw or voice tremor. Neck is supple with full range of passive and active motion. There are no carotid bruits on auscultation. Oropharynx exam reveals: mild mouth dryness, adequate dental hygiene and moderate airway crowding, due to smaller airway entry.  Tonsils absent.  Mallampati class II, no overbite.  Tongue protrudes centrally and palate elevates symmetrically, neck circumference 14 three-quarter inches.  Chest: Clear to auscultation without wheezing, rhonchi or crackles noted.  Heart: S1+S2+0, regular and normal without murmurs, rubs or gallops noted.   Abdomen: Soft, non-tender and non-distended.  Extremities: There is no obvious edema in the distal lower extremities bilaterally.   Skin: Warm and dry without trophic changes noted.   Musculoskeletal: exam reveals no obvious joint deformities.   Neurologically:  Mental status: The patient is  awake, alert and oriented in all 4 spheres. Her immediate and remote memory, attention, language skills and fund of knowledge are appropriate. There is no evidence of aphasia, agnosia, apraxia or anomia. Speech is clear with normal prosody and enunciation. Thought process is linear. Mood is normal and affect is normal.  Cranial nerves II - XII are as described above under HEENT exam.  Motor exam: Normal bulk, strength and tone is noted. There is no obvious action or resting tremor.  Fine motor skills and coordination: grossly intact.  Cerebellar testing: No dysmetria or intention tremor. There is no truncal or gait ataxia.  Sensory exam: intact to light touch in the upper and lower extremities.  Gait, station and balance: She stands easily. No veering to one side is noted. No leaning to one side is noted. Posture is age-appropriate and stance is narrow based. Gait shows normal stride length and normal pace. No problems turning are noted.   Assessment and Plan:  In summary, Gwendolyn Perez is a very pleasant 76 y.o.-year old female with an underlying medical history of arthritis, neck pain with paresthesias, thyroid disease, reflux disease, history of heart murmur, hyperlipidemia, and mildly overweight state, whose history and physical exam are concerning for sleep disordered breathing, particularly obstructive sleep apnea (OSA). A laboratory attended sleep study is typically considered "gold standard" for evaluation of sleep disordered breathing.   I had a long chat with the patient and her husband about my findings and the diagnosis of sleep apnea, particularly OSA, its prognosis and treatment options. We talked about medical/conservative treatments, surgical interventions and non-pharmacological approaches for symptom control. I explained, in particular, the risks and ramifications of untreated moderate to severe OSA, especially with respect to developing cardiovascular disease down the road, including  congestive heart failure (CHF), difficult to treat hypertension, cardiac arrhythmias (particularly A-fib), neurovascular complications including TIA, stroke and dementia. Even type 2 diabetes has, in part, been linked to untreated OSA. Symptoms of untreated OSA may include (but may not be limited to) daytime sleepiness, nocturia (i.e. frequent nighttime urination), memory problems, mood irritability and suboptimally controlled or worsening mood disorder such as depression and/or anxiety, lack of energy, lack of motivation, physical discomfort, as well as recurrent headaches, especially morning or nocturnal headaches. We talked about the importance of maintaining a healthy lifestyle and striving  for healthy weight.  In addition, we talked about the importance of striving for and maintaining good sleep hygiene. I recommended a sleep study at this time. I outlined the differences between a laboratory attended sleep study which is considered more comprehensive and accurate over the option of a home sleep test (HST); the latter may lead to underestimation of sleep disordered breathing in some instances and does not help with diagnosing upper airway resistance syndrome and is not accurate enough to diagnose primary central sleep apnea typically. I outlined possible surgical and non-surgical treatment options of OSA, including the use of a positive airway pressure (PAP) device (i.e. CPAP, AutoPAP/APAP or BiPAP in certain circumstances), a custom-made dental device (aka oral appliance, which would require a referral to a specialist dentist or orthodontist typically, and is generally speaking not considered for patients with full dentures or edentulous state), upper airway surgical options, such as traditional UPPP (which is not considered a first-line treatment) or the Inspire device (hypoglossal nerve stimulator, which would involve a referral for consultation with an ENT surgeon, after careful selection, following  inclusion criteria - also not first-line treatment). I explained the PAP treatment option to the patient in detail, as this is generally considered first-line treatment.  The patient indicated that she would be willing to try PAP therapy, if the need arises. I explained the importance of being compliant with PAP treatment, not only for insurance purposes but primarily to improve patient's symptoms symptoms, and for the patient's long term health benefit, including to reduce Her cardiovascular risks longer-term.    We will pick up our discussion about the next steps and treatment options after testing.  We will keep them posted as to the test results by phone call and/or MyChart messaging where possible.  We will plan to follow-up in sleep clinic accordingly as well.  I answered all their questions today and the patient and her husband were in agreement.   I encouraged them to call with any interim questions, concerns, problems or updates or email [place] through [place].  Generally speaking, sleep test authorizations may take up to 2 weeks, sometimes less, sometimes longer, the patient is encouraged to get in touch with [place] if they do not hear back from the sleep lab staff directly within the next 2 weeks.  Thank you very much for allowing me to participate in the care of this nice patient. If I can be of any further assistance to you please do not hesitate to talk to me.  Sincerely,   Star Age, MD, PhD

## 2023-03-19 ENCOUNTER — Telehealth: Payer: Self-pay | Admitting: Neurology

## 2023-03-19 NOTE — Telephone Encounter (Signed)
[employer] medicare no auth req   Patient is scheduled at [facility] for 04/13/23 at 9 pm.  Mailed packet to the patient.

## 2023-04-10 ENCOUNTER — Encounter: Payer: Medicare Other | Admitting: Internal Medicine

## 2023-04-10 ENCOUNTER — Ambulatory Visit: Payer: Medicare Other

## 2023-04-13 ENCOUNTER — Ambulatory Visit (INDEPENDENT_AMBULATORY_CARE_PROVIDER_SITE_OTHER): Payer: Medicare Other | Admitting: Neurology

## 2023-04-13 DIAGNOSIS — E663 Overweight: Secondary | ICD-10-CM

## 2023-04-13 DIAGNOSIS — G4720 Circadian rhythm sleep disorder, unspecified type: Secondary | ICD-10-CM

## 2023-04-13 DIAGNOSIS — G4719 Other hypersomnia: Secondary | ICD-10-CM

## 2023-04-13 DIAGNOSIS — Z9189 Other specified personal risk factors, not elsewhere classified: Secondary | ICD-10-CM

## 2023-04-13 DIAGNOSIS — R0683 Snoring: Secondary | ICD-10-CM

## 2023-04-13 DIAGNOSIS — F458 Other somatoform disorders: Secondary | ICD-10-CM

## 2023-04-13 DIAGNOSIS — R519 Headache, unspecified: Secondary | ICD-10-CM

## 2023-04-16 NOTE — Procedures (Signed)
Physician Interpretation:     [facility] POLYSOMNOGRAPHY  INTERPRETATION REPORT   STUDY DATE:  04/13/2023     PATIENT NAME:  Gwendolyn Perez         DATE OF BIRTH:  12/29/1945  PATIENT ID:  [id number]    TYPE OF STUDY:  PSG  READING PHYSICIAN: Huston Foley, MD, PhD   SCORING TECHNICIAN: Margaretann Loveless, RPSGT  Referred by: Dr. Levert Feinstein  History and Indication for Testing: 77 year old female with an underlying medical history of arthritis, neck pain with paresthesias, thyroid disease, reflux disease, history of heart murmur, hyperlipidemia, and mildly overweight state, who reports snoring and excessive daytime somnolence. Her Epworth sleepiness score is 15 out of 24, fatigue severity score is 11 out of 63.  Height: 64 in Weight: 153 lb (BMI 26) Neck Size: 15 in   MEDICATIONS: Tylenol, Valisone, Cymbalta, Estrace, Synthroid, Prilosec, Valtrex   TECHNICAL DESCRIPTION: A registered sleep technologist was in attendance for the duration of the recording.  Data collection, scoring, video monitoring, and reporting were performed in compliance with the AASM Manual for the Scoring of Sleep and Associated Events; (Hypopnea is scored based on the criteria listed in Section VIII D. 1b in the AASM Manual V2.6 using a 4% oxygen desaturation rule or Hypopnea is scored based on the criteria listed in Section VIII D. 1a in the AASM Manual V2.6 using 3% oxygen desaturation and /or arousal rule).   SLEEP CONTINUITY AND SLEEP ARCHITECTURE:  Lights-out was at 21:44: and lights-on at  05:07:, with a total recording time of 7 hours, 23 min. Total sleep time ( TST) was 369.0 minutes with a normal sleep efficiency at 83.3%. There was  0.5% REM sleep.   BODY POSITION:  TST was divided  between the following sleep positions: 17.5% supine;  82.5% lateral;  0% prone. Duration of total sleep and percent of total sleep in their respective position is as follows: supine 64 minutes (17%),  non-supine 305 minutes (83%); right 169 minutes (46%), left 135 minutes (37%), and prone 00 minutes (0%).  Total supine REM sleep time was 00 minutes (0% of total REM sleep).  Sleep latency was normal at 19.0 minutes.  REM sleep latency was markedly increased at 414.5 minutes. Of the total sleep time, the percentage of stage N1 sleep was 15.3%, which is increased, stage N2 sleep was 84%, which is markedly increased, stage N3 sleep was absent, and REM sleep was near-absent at 0.5%. Wake after sleep onset (WASO) time accounted for 55 minutes with mild to moderate sleep fragmentation noted.   RESPIRATORY MONITORING:   Based on CMS criteria (using a 4% oxygen desaturation rule for scoring hypopneas), there were 8 apneas (8 obstructive; 0 central; 0 mixed), and 20 hypopneas.  Apnea index was 1.3. Hypopnea index was 3.3. The apnea-hypopnea index was 4.6/hour overall (19.5 supine, 0 non-supine; 0.0 REM, 0.0 supine REM).  There were 0 respiratory effort-related arousals (RERAs).  The RERA index was 0 events/h. Total respiratory disturbance index (RDI) was 4.6 events/h. RDI results showed: supine RDI  19.5 /h; non-supine RDI 1.4 /h; REM RDI 0.0 /h, supine REM RDI 0.0 /h.   Based on AASM criteria (using a 3% oxygen desaturation and /or arousal rule for scoring hypopneas), there were 8 apneas (8 obstructive; 0 central; 0 mixed), and 21 hypopneas. Apnea index was 1.3. Hypopnea index was 3.4. The apnea-hypopnea index was 4.7 overall (20.5 supine, 0 non-supine; 0.0 REM, 0.0 supine REM).  There were 0  respiratory effort-related arousals (RERAs).  The RERA index was 0 events/h. Total respiratory disturbance index (RDI) was 4.7 events/h. RDI results showed: supine RDI  20.5 /h; non-supine RDI 1.4 /h; REM RDI 0.0 /h, supine REM RDI 0.0 /h.    OXIMETRY: Oxyhemoglobin Saturation Nadir during sleep was at  87% from a mean of 95%.  Of the Total sleep time (TST)   hypoxemia (=<88%) was present for  0.5 minutes, or 0.1% of total  sleep time.   LIMB MOVEMENTS: There were 25 periodic limb movements of sleep (4.1/hr), of which 0 (0.0/hr) were associated with an arousal.   AROUSAL: There were 187 arousals in total, for an arousal index of 30 arousals/hour.  Of these, 22 were identified as respiratory-related arousals (4 /h), 0 were PLM-related arousals (0 /h), and 185 were non-specific arousals (30 /h).   EEG: Review of the EEG showed no abnormal electrical discharges and symmetrical bihemispheric findings.    EKG: The EKG revealed normal sinus rhythm (NSR). The average heart rate during sleep was 68 bpm.   AUDIO/VIDEO REVIEW: The audio and video review did not show any abnormal or unusual behaviors, movements, phonations or vocalizations. The patient took no restroom breaks. Snoring was noted, mild to moderate.  POST-STUDY QUESTIONNAIRE: Post study, the patient indicated, that sleep was the same as usual.   IMPRESSION:  1. Primary Snoring 2. Dysfunctions associated with sleep stages or arousal from sleep   RECOMMENDATIONS:  1. This study does not demonstrate any significant obstructive or central sleep disordered breathing with an AHI of less than 5/hour - Her AHI was 4.6/hour, and oxygen desaturation of 87% with time below or at 88% of less than 1 minute or the night. Mild to moderate intermittent snoring was noted. Treatment with a positive airway pressure device, such as CPAP or autoPAP is not indicated. Avoidance of the supine sleep position may aid in reducing her snoring. Please note, that the near-absence of REM sleep may have underestimated her sleep disordered breathing.  2. This study shows sleep fragmentation and abnormal sleep stage percentages; these are nonspecific findings and per se do not signify an intrinsic sleep disorder or a cause for the patient's sleep-related symptoms. Causes include (but are not limited to) the first night effect of the sleep study, circadian rhythm disturbances, medication effect or  an underlying mood disorder or medical problem.  3. The patient should be cautioned not to drive, work at heights, or operate dangerous or heavy equipment when tired or sleepy. Review and reiteration of good sleep hygiene measures should be pursued with any patient. 4. The patient will be advised to follow up with the referring provider, who will be notified of the test results.  I certify that I have reviewed the raw data recording prior to the issuance of this report in accordance with the standards of the American Academy of Sleep Medicine (AASM).  Huston Foley, MD, PhD Medical Director, [facility]) Diplomat, ABPN (Neurology and Sleep)               Technical Report:   General Information  Name: Shanekqua, Wooderson BMI: 26.26 Physician: Huston Foley, MD  ID: [id number] Height: 64.0 in Technician: Margaretann Loveless, RPSGT  Sex: Female Weight: 153.0 lb Record: [id number]  Age: 74 [[birth year]] Date: 04/13/2023    Medical & Medication History    77 year old female with an underlying medical history of arthritis, neck pain with paresthesias, thyroid disease, reflux disease, history of heart murmur, hyperlipidemia,  and mildly overweight state, who reports snoring and excessive daytime somnolence. 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 She has had rare restless leg symptoms. Her paresthesias improved with Cymbalta. Tylenol, Valisone, Cymbalta, Estrace, Synthroid, Prilosec, Valtrex   Sleep Disorder      Comments   The patient came into the lab for a PSG. The patient took Tylenol PM prior to start of sleep study. Per patient she took Cymbalta prior to arrival. The patient had no restroom breaks. EKG kept in NSR. Mild to moderate snoring. Snoring was louder while supine. Respiratory events scored with a 4% desat. Majority of respiratory events were while supine. Slept lateral and supine. PML's witnessed. AHI was  8.2 after 2 hrs of TST. Very short period of REM.    Lights out: 09:44:31 PM Lights on: 05:07:25 AM   Time Total Supine Side Prone Upright  Recording (TRT) 7h 23.56m 1h 15.88m 6h 8.13m 0h 0.15m 0h 0.70m  Sleep (TST) 6h 9.44m 1h 4.81m 5h 4.35m 0h 0.81m 0h 0.46m   Latency N1 N2 N3 REM Onset Per. Slp. Eff.  Actual 0h 0.46m 0h 3.68m 0h 0.33m 6h 54.82m 0h 19.72m 1h 27.25m 83.30%   Stg Dur Wake N1 N2 N3 REM  Total 74.0 56.5 310.5 0.0 2.0  Supine 10.5 10.0 54.5 0.0 0.0  Side 63.5 46.5 256.0 0.0 2.0  Prone 0.0 0.0 0.0 0.0 0.0  Upright 0.0 0.0 0.0 0.0 0.0   Stg % Wake N1 N2 N3 REM  Total 16.7 15.3 84.1 0.0 0.5  Supine 2.4 2.7 14.8 0.0 0.0  Side 14.3 12.6 69.4 0.0 0.5  Prone 0.0 0.0 0.0 0.0 0.0  Upright 0.0 0.0 0.0 0.0 0.0     Apnea Summary Sub Supine Side Prone Upright  Total 8 Total 8 8 0 0 0    REM 0 0 0 0 0    NREM 8 8 0 0 0  Obs 8 REM 0 0 0 0 0    NREM 8 8 0 0 0  Mix 0 REM 0 0 0 0 0    NREM 0 0 0 0 0  Cen 0 REM 0 0 0 0 0    NREM 0 0 0 0 0   Rera Summary Sub Supine Side Prone Upright  Total 0 Total 0 0 0 0 0    REM 0 0 0 0 0    NREM 0 0 0 0 0   Hypopnea Summary Sub Supine Side Prone Upright  Total 21 Total 21 14 7  0 0    REM 0 0 0 0 0    NREM 21 14 7  0 0   4% Hypopnea Summary Sub Supine Side Prone Upright  Total (4%) 20 Total 20 13 7  0 0    REM 0 0 0 0 0    NREM 20 13 7  0 0     AHI Total Obs Mix Cen  4.72 Apnea 1.30 1.30 0.00 0.00   Hypopnea 3.41 -- -- --  4.55 Hypopnea (4%) 3.25 -- -- --    Total Supine Side Prone Upright  Position AHI 4.72 20.47 1.38 0.00 0.00  REM AHI 0.00   NREM AHI 4.74   Position RDI 4.72 20.47 1.38 0.00 0.00  REM RDI 0.00   NREM RDI 4.74    4% Hypopnea Total Supine Side Prone Upright  Position AHI (4%) 4.55 19.53 1.38 0.00 0.00  REM AHI (4%) 0.00  NREM AHI (4%) 4.58   Position RDI (4%) 4.55 19.53 1.38 0.00 0.00  REM RDI (4%) 0.00   NREM RDI (4%) 4.58    Desaturation Information Threshold: 2% <100% <90% <80% <70% <60% <50% <40%  Supine 80.0  3.0 0.0 0.0 0.0 0.0 0.0  Side 150.0 7.0 0.0 0.0 0.0 0.0 0.0  Prone 0.0 0.0 0.0 0.0 0.0 0.0 0.0  Upright 0.0 0.0 0.0 0.0 0.0 0.0 0.0  Total 230.0 10.0 0.0 0.0 0.0 0.0 0.0  Index 32.5 1.4 0.0 0.0 0.0 0.0 0.0   Threshold: 3% <100% <90% <80% <70% <60% <50% <40%  Supine 38.0 3.0 0.0 0.0 0.0 0.0 0.0  Side 59.0 7.0 0.0 0.0 0.0 0.0 0.0  Prone 0.0 0.0 0.0 0.0 0.0 0.0 0.0  Upright 0.0 0.0 0.0 0.0 0.0 0.0 0.0  Total 97.0 10.0 0.0 0.0 0.0 0.0 0.0  Index 13.7 1.4 0.0 0.0 0.0 0.0 0.0   Threshold: 4% <100% <90% <80% <70% <60% <50% <40%  Supine 21.0 3.0 0.0 0.0 0.0 0.0 0.0  Side 41.0 7.0 0.0 0.0 0.0 0.0 0.0  Prone 0.0 0.0 0.0 0.0 0.0 0.0 0.0  Upright 0.0 0.0 0.0 0.0 0.0 0.0 0.0  Total 62.0 10.0 0.0 0.0 0.0 0.0 0.0  Index 8.8 1.4 0.0 0.0 0.0 0.0 0.0   Threshold: 3% <100% <90% <80% <70% <60% <50% <40%  Supine 38 3 0 0 0 0 0  Side 59 7 0 0 0 0 0  Prone 0 0 0 0 0 0 0  Upright 0 0 0 0 0 0 0  Total 97 10 0 0 0 0 0   Awakening/Arousal Information # of Awakenings 33  Wake after sleep onset 55.30m  Wake after persistent sleep 23.30m   Arousal Assoc. Arousals Index  Apneas 7 1.1  Hypopneas 15 2.4  Leg Movements 0 0.0  Snore 0 0.0  PTT Arousals 0 0.0  Spontaneous 185 30.1  Total 207 33.7  Leg Movement Information PLMS LMs Index  Total LMs during PLMS 25 4.1  LMs w/ Microarousals 0 0.0   LM LMs Index  w/ Microarousal 0 0.0  w/ Awakening 0 0.0  w/ Resp Event 0 0.0  Spontaneous 49 8.0  Total 49 8.0     Desaturation threshold setting: 3% Minimum desaturation setting: 10 seconds SaO2 nadir: 53% The longest event was a 47 sec obstructive Hypopnea with a minimum SaO2 of 90%. The lowest SaO2 was 87% associated with a 17 sec obstructive Hypopnea. EKG Rates EKG Avg Max Min  Awake 69 88 56  Asleep 68 82 56  EKG Events: N/A

## 2023-04-23 ENCOUNTER — Telehealth: Payer: Self-pay | Admitting: *Deleted

## 2023-04-23 NOTE — Telephone Encounter (Signed)
-----   Message from Huston Foley, MD sent at 04/22/2023  5:25 PM EDT ----- Patient referred by Dr. Terrace Arabia, seen by me on 01/30/23, patient had a diagnostic PSG on 04/13/23.   Please call and notify the patient that the recent sleep study did not show any significant sleep apnea or significant underlying sleep disorder, no significant EKG (heart tracing) changes or EEG (brain wave tracing) changes. Her AHI was 4.6/hour - and oxygen saturations remained at or above 87% for the night. Some snoring was noted. Treatment with a positive airway pressure device, such as CPAP or autoPAP is not indicated.  Avoidance of the supine sleep position may reduce snoring. No organic reason for sleep difficulty was found on this test. Patient did not achieve all stages of sleep and slept almost exclusively in light stage sleep. At this juncture, she can follow up with her PCP and other providers as scheduled/planned.   Thanks,  Huston Foley, MD, PhD [facility])

## 2023-04-23 NOTE — Telephone Encounter (Signed)
-----   Message from Saima Athar, MD sent at 04/22/2023  5:25 PM EDT ----- Patient referred by Dr. Yan, seen by me on 01/30/23, patient had a diagnostic PSG on 04/13/23.   Please call and notify the patient that the recent sleep study did not show any significant sleep apnea or significant underlying sleep disorder, no significant EKG (heart tracing) changes or EEG (brain wave tracing) changes. Her AHI was 4.6/hour - and oxygen saturations remained at or above 87% for the night. Some snoring was noted. Treatment with a positive airway pressure device, such as CPAP or autoPAP is not indicated.  Avoidance of the supine sleep position may reduce snoring. No organic reason for sleep difficulty was found on this test. Patient did not achieve all stages of sleep and slept almost exclusively in light stage sleep. At this juncture, she can follow up with her PCP and other providers as scheduled/planned.   Thanks,  Saima Athar, MD, PhD [facility] ([facility])   

## 2023-04-23 NOTE — Telephone Encounter (Signed)
Spoke to patient gave sleep study results  Gave Dr Johny Sax recommendations. Pt still had lots of questions for Dr.Athar and requested a f/u visit  F/u visit scheduled 05/2023 Pt expressed understanding and thanked me for calling

## 2023-05-15 ENCOUNTER — Encounter: Payer: Medicare Other | Admitting: Internal Medicine

## 2023-05-22 ENCOUNTER — Ambulatory Visit: Payer: Medicare Other | Admitting: Neurology

## 2023-06-02 ENCOUNTER — Other Ambulatory Visit: Payer: Self-pay

## 2023-06-02 DIAGNOSIS — B002 Herpesviral gingivostomatitis and pharyngotonsillitis: Secondary | ICD-10-CM

## 2023-06-02 MED ORDER — VALACYCLOVIR HCL 1 G PO TABS: 500.0000 mg | ORAL_TABLET | Freq: Every day | ORAL | 5 refills | Status: DC | PRN

## 2023-06-11 ENCOUNTER — Ambulatory Visit: Payer: Medicare Other | Admitting: Neurology

## 2023-06-11 ENCOUNTER — Encounter: Payer: Self-pay | Admitting: Neurology

## 2023-06-11 VITALS — BP 114/62 | HR 64 | Ht 64.0 in | Wt 150.0 lb

## 2023-06-11 DIAGNOSIS — G479 Sleep disorder, unspecified: Secondary | ICD-10-CM

## 2023-06-11 DIAGNOSIS — R0683 Snoring: Secondary | ICD-10-CM

## 2023-06-11 NOTE — Progress Notes (Signed)
Subjective:    Patient ID: Gwendolyn Perez is a 77 y.o. female.  HPI    Interim history:   Gwendolyn Perez is a 77 year old female with an underlying medical history of arthritis, neck pain with paresthesias, thyroid disease, reflux disease, history of heart murmur, hyperlipidemia, and mildly overweight state, who presents for follow-up consultation of her sleep disturbance, after interim testing.  The patient is accompanied by her husband today.  I first met her at the request of Dr. Terrace Arabia on 01/30/2023, at which time she reported snoring and excessive daytime somnolence.  She was advised to proceed with a sleep study.  She had a baseline sleep study through our sleep lab on 04/13/2023 which showed a sleep efficiency of 83.3%, she had minimal REM sleep.  Sleep latency was 19 minutes, REM latency markedly delayed at 414.5 minutes.  She had a markedly increased percentage of stage II sleep at 84%, absence of slow-wave sleep and near absence of REM sleep at 0.5%.  She had mild to moderate sleep fragmentation with a wake after sleep onset time of 55 minutes.  Total AHI was 4.6/h, average oxygen saturation was 95%, nadir was 87%, time below or at 88% saturation was 0.5 minutes for the night.  She had no significant PLM's, no significant EKG or EEG changes.  Based on the test results she did not qualify for home AutoPap therapy.  She was notified of the test results by phone call.  She requested a follow-up appointment to discuss the results of her sleep study.    Today, 06/11/2023: She reports sleeping better since switching Cymbalta to AM (60 mg once daily as opposed to 30 mg bid). She endorses dreaming. She still snores and is hoping to snore less with weight loss (10 lb goal). She is considering mouth taping for snoring, as she heard about it in a pod cast. She will talk to her SIL who is a dentist about a mouth appliance.  She does sleep a little bit more during the day per her husband, she likes to nap.  They bought a  camper recently and she sleeps well in the camper bunk bed or in the queen size bed of the camper.  The patient's allergies, current medications, family history, past medical history, past social history, past surgical history and problem list were reviewed and updated as appropriate.   Previously:   01/30/23: (She) reports snoring and excessive daytime somnolence.  Her Epworth sleepiness score is 15 out of 24, fatigue severity score is 11 out of 63.  I reviewed your office note from 12/26/2022.  She reports trouble maintaining sleep.  She lives with her husband, they have no pets in the household, she does use her phone to watch at night and her husband states that she has a sitting on her nightstand and she turns towards the phone to watch something on it until she feels sleepy.  Bedtime is generally around 9:30 PM and rise time around 7 AM.  They have 4 grown children and 10 grandchildren.  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  She does not wake up rested.  She has recurrent morning headaches but no night to night nocturia.  As she recalls, her daughter uses an oral appliance.  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  She drinks caffeine in the form of coffee, 1 or 2 cups in the mornings, typically  never after 2 PM.  She does not drink any alcohol, she is a non-smoker.  She had a tonsillectomy at age 45.  She has had rare restless leg symptoms.  Her paresthesias improved with Cymbalta.    Her Past Medical History Is Significant For: Past Medical History:  Diagnosis Date   Arthritis 10 yrs ago   Thumbs,coxic, neck, hip, knees   GERD (gastroesophageal reflux disease) March 2020   Hiatal hernia   Healthcare maintenance 11/22/2022   Heart murmur Birth   Functional heart murmur   Hypercholesterolemia 02/20/2022   Obstructive sleep apnea (adult) (pediatric) 02/20/2022   Thyroid disease Hypothyroidism   Mildly affected     Her Past Surgical History Is Significant For: Past Surgical History:  Procedure Laterality Date   ABDOMINAL HYSTERECTOMY  2000   By laparoscopic surgery    Her Family History Is Significant For: Family History  Problem Relation Age of Onset   Healthy Mother    Cancer Father    Hearing loss Paternal Grandfather    Arthritis Paternal Grandmother    Arthritis Paternal Aunt    Hearing loss Paternal Aunt     Her Social History Is Significant For: Social History   Socioeconomic History   Marital status: Married    Spouse name: Not on file   Number of children: Not on file   Years of education: Not on file   Highest education level: Not on file  Occupational History   Not on file  Tobacco Use   Smoking status: Never    Passive exposure: Never   Smokeless tobacco: Never  Vaping Use   Vaping Use: Never used  Substance and Sexual Activity   Alcohol use: Yes    Alcohol/week: 2.0 standard drinks of alcohol    Types: 2 Glasses of wine per week    Comment: Socially when with friends   Drug use: Never   Sexual activity: Yes    Comment: Hysterectomy  Other Topics Concern   Not on file  Social History Narrative   Married   Right handed   Caffeine- 2 cups in the morning   Social Determinants of Health   Financial Resource Strain: Low Risk  (11/28/2022)   Overall Financial Resource Strain (CARDIA)    Difficulty of Paying Living Expenses: Not hard at all  Food Insecurity: No Food Insecurity (12/10/2022)   Hunger Vital Sign    Worried About Running Out of Food in the Last Year: Never true    Ran Out of Food in the Last Year: Never true  Transportation Needs: No Transportation Needs (12/10/2022)   PRAPARE - Administrator, Civil Service (Medical): No    Lack of Transportation (Non-Medical): No  Physical Activity: Inactive (11/28/2022)   Exercise Vital Sign    Days of Exercise per Week: 0 days    Minutes of Exercise per Session: 0 min  Stress: No Stress  Concern Present (11/28/2022)   [employer] of Occupational Health - Occupational Stress Questionnaire    Feeling of Stress : Not at all  Social Connections: Moderately Isolated (12/10/2022)   Social Connection and Isolation Panel [NHANES]    Frequency of Communication with Friends and Family: More than three times a week    Frequency of Social Gatherings with Friends and Family: More than three times a week    Attends Religious Services: Never    Database administrator or Organizations: No    Attends Banker Meetings: Never  Marital Status: Married    Her Allergies Are:  Allergies  Allergen Reactions   Black Cohosh Rash   Sulfa Antibiotics Rash  :   Her Current Medications Are:  Outpatient Encounter Medications as of 06/11/2023  Medication Sig   acetaminophen (TYLENOL) 500 MG tablet 2 tablets as needed   Cholecalciferol (VITAMIN D3) 1000 units CAPS Take 1 capsule by mouth daily.   DULoxetine (CYMBALTA) 30 MG capsule Take 1 capsule (30 mg total) by mouth 2 (two) times daily. (Patient taking differently: Take 60 mg by mouth in the morning.)   estradiol (ESTRACE) 0.1 MG/GM vaginal cream Place 1 Applicatorful vaginally daily.   levothyroxine (SYNTHROID) 25 MCG tablet    omeprazole (PRILOSEC) 40 MG capsule Take 40 mg by mouth every morning.   valACYclovir (VALTREX) 1000 MG tablet Take 0.5 tablets (500 mg total) by mouth daily as needed (herpes stomatitis).   betamethasone valerate ointment (VALISONE) 0.1 % Apply 1 Application topically 2 (two) times daily. (Patient not taking: Reported on 06/11/2023)   PFIZER COVID-19 VAC BIVALENT injection  (Patient not taking: Reported on 06/11/2023)   No facility-administered encounter medications on file as of 06/11/2023.  :  Review of Systems:  Out of a complete 14 point review of systems, all are reviewed and negative with the exception of these symptoms as listed below:  Review of Systems  Neurological:         Patient would lke  to discuss sleep study results and if Cymbalta had an effect on results. She has changes to taking the Cymbalta in the morning. She is sleeping well since change she reports. Her husband Jake Shark is with her today. ESS-6. Patient wants to discuss snoring, mouth tape, and mouth breathing.    Objective:  Neurological Exam  Physical Exam Physical Examination:   Vitals:   06/11/23 0913  BP: 114/62  Pulse: 64  SpO2: 94%    General Examination: The patient is a very pleasant 77 y.o. female in no acute distress. She appears well-developed and well-nourished and well groomed.   HEENT: Normocephalic, atraumatic, pupils are equal and reactive to light, tracking is well-preserved, hearing grossly intact.  Face is symmetric with normal facial animation, speech is clear without dysarthria, hypophonia or voice tremor.  No carotid bruits on auscultation, neck with full range of motion.    Chest: Clear to auscultation without wheezing, rhonchi or crackles noted.   Heart: S1+S2+0, regular and normal without murmurs, rubs or gallops noted.    Abdomen: Soft, non-distended.   Extremities: There is no obvious swelling noted in the distal lower extremities bilaterally.    Skin: Warm and dry without trophic changes noted.    Musculoskeletal: exam reveals no obvious joint deformities.    Neurologically:  Mental status: The patient is awake, alert and oriented in all 4 spheres. Her immediate and remote memory, attention, language skills and fund of knowledge are appropriate. There is no evidence of aphasia, agnosia, apraxia or anomia. Speech is clear with normal prosody and enunciation. Thought process is linear. Mood is normal and affect is normal.  Cranial nerves II - XII are as described above under HEENT exam.  Motor exam: Normal bulk, moving all 4 extremities, no obvious action or resting tremor.  Fine motor skills and coordination: grossly intact.  Cerebellar testing: No dysmetria or intention  tremor. There is no truncal or gait ataxia.   Gait, station and balance: She stands easily. No veering to one side is noted. No leaning to one side is  noted. Posture is age-appropriate and stance is narrow based. Gait shows normal stride length and normal pace. No problems turning are noted.    Assessment and Plan:  In summary, Kyri Latorre is a 77 year old female with an underlying medical history of arthritis, neck pain with paresthesias, thyroid disease, reflux disease, history of heart murmur, hyperlipidemia, and mildly overweight state, who presents for follow-up consultation of her sleep disturbance, after interim testing.  She had a baseline sleep study through our sleep lab on 04/13/2023 which showed a sleep efficiency of 83.3%, she had minimal REM sleep.  Sleep latency was 19 minutes, REM latency markedly delayed at 414.5 minutes.  She had a markedly increased percentage of stage II sleep at 84%, absence of slow-wave sleep and near absence of REM sleep at 0.5%.  She had mild to moderate sleep fragmentation with a wake after sleep onset time of 55 minutes. Her total AHI was 4.6/h, average oxygen saturation was 95%, nadir was 87%, time below or at 88% saturation was 0.5 minutes for the night.  She had no significant PLM's, no significant EKG or EEG changes.  Snoring was in the mild to moderate range.  She is considering using a mouth tape for mouth breathing.  While I do not typically recommend mouth taping, she is advised that she could try it out and see if it helps her mouth breathing.  For snoring, she may be a good candidate for an oral appliance.  She would like to talk to her son-in-law who is a Education officer, community about it.  If she would like a referral, we can certainly facilitate.  At this juncture, she can keep her follow-up appointment as scheduled with Dr. Terrace Arabia and her other providers, we talked about her sleep study results and the fact that she does not need to be treated with a CPAP machine.  She does  report sleeping better since switching the Cymbalta to morning time.  Her husband indicates that she does like to nap a little bit more consistently during the day.  This could be from the Cymbalta as well.  We talked about medication effect on REM sleep.  I answered all the questions today and the patient and her husband were in agreement with the plan.   I spent 20 minutes in total face-to-face time and in reviewing records during pre-charting, more than 50% of which was spent in counseling and coordination of care, reviewing test results, reviewing medications and treatment regimen and/or in discussing or reviewing the diagnosis of snoring, sleep disturbance, the prognosis and treatment options. Pertinent laboratory and imaging test results that were available during this visit with the patient were reviewed by me and considered in my medical decision making (see chart for details).

## 2023-06-11 NOTE — Patient Instructions (Signed)
It was nice to see you again today. I am glad to hear that you are sleeping better. If you would like to pursue treatment for snoring with an oral appliance, you can talk to your son-in-law about this, if you would like a referral from our office, I would be happy to refer you to dentistry for consideration of an oral appliance.

## 2023-06-19 ENCOUNTER — Ambulatory Visit (INDEPENDENT_AMBULATORY_CARE_PROVIDER_SITE_OTHER): Payer: Medicare Other | Admitting: Internal Medicine

## 2023-06-19 ENCOUNTER — Ambulatory Visit (INDEPENDENT_AMBULATORY_CARE_PROVIDER_SITE_OTHER): Payer: Medicare Other

## 2023-06-19 VITALS — BP 136/88 | HR 60 | Temp 98.1°F | Ht 64.0 in | Wt 151.3 lb

## 2023-06-19 DIAGNOSIS — E063 Autoimmune thyroiditis: Secondary | ICD-10-CM

## 2023-06-19 DIAGNOSIS — K648 Other hemorrhoids: Secondary | ICD-10-CM | POA: Diagnosis not present

## 2023-06-19 DIAGNOSIS — Z7189 Other specified counseling: Secondary | ICD-10-CM | POA: Diagnosis not present

## 2023-06-19 DIAGNOSIS — Z0000 Encounter for general adult medical examination without abnormal findings: Secondary | ICD-10-CM | POA: Diagnosis not present

## 2023-06-19 DIAGNOSIS — B002 Herpesviral gingivostomatitis and pharyngotonsillitis: Secondary | ICD-10-CM

## 2023-06-19 DIAGNOSIS — M542 Cervicalgia: Secondary | ICD-10-CM

## 2023-06-19 DIAGNOSIS — R0683 Snoring: Secondary | ICD-10-CM

## 2023-06-19 DIAGNOSIS — K219 Gastro-esophageal reflux disease without esophagitis: Secondary | ICD-10-CM

## 2023-06-19 DIAGNOSIS — H6120 Impacted cerumen, unspecified ear: Secondary | ICD-10-CM

## 2023-06-19 DIAGNOSIS — M25551 Pain in right hip: Secondary | ICD-10-CM

## 2023-06-19 DIAGNOSIS — H9190 Unspecified hearing loss, unspecified ear: Secondary | ICD-10-CM

## 2023-06-19 DIAGNOSIS — H6122 Impacted cerumen, left ear: Secondary | ICD-10-CM

## 2023-06-19 DIAGNOSIS — R12 Heartburn: Secondary | ICD-10-CM

## 2023-06-19 DIAGNOSIS — R030 Elevated blood-pressure reading, without diagnosis of hypertension: Secondary | ICD-10-CM | POA: Diagnosis not present

## 2023-06-19 DIAGNOSIS — K449 Diaphragmatic hernia without obstruction or gangrene: Secondary | ICD-10-CM | POA: Diagnosis not present

## 2023-06-19 MED ORDER — OMEPRAZOLE 20 MG PO CPDR: 20.0000 mg | DELAYED_RELEASE_CAPSULE | Freq: Every day | ORAL | 3 refills | Status: DC

## 2023-06-19 MED ORDER — VALACYCLOVIR HCL 1 G PO TABS: 500.0000 mg | ORAL_TABLET | Freq: Every day | ORAL | 5 refills | Status: DC | PRN

## 2023-06-19 MED ORDER — DULOXETINE HCL 60 MG PO CPEP: 60.0000 mg | ORAL_CAPSULE | Freq: Every day | ORAL | 2 refills | Status: DC

## 2023-06-19 NOTE — Assessment & Plan Note (Signed)
Followed by Dr. Sharl Ma who prescribes Synthroid.

## 2023-06-19 NOTE — Assessment & Plan Note (Signed)
Has received her hearing aids which are working well.

## 2023-06-19 NOTE — Assessment & Plan Note (Signed)
Sleep test negative for OSA.  Will resolve problem.

## 2023-06-19 NOTE — Assessment & Plan Note (Signed)
Left ear occluded today but inadequate time to flush.  We will do this another time.

## 2023-06-19 NOTE — Assessment & Plan Note (Signed)
Prolapsing internal hemorrhoid is minimally symptomatic but she is ready to have it addressed.  Referral to [facility] made.  BMs are regular and easy to pass.

## 2023-06-19 NOTE — Assessment & Plan Note (Signed)
>>  ASSESSMENT AND PLAN FOR HEARING IMPAIRMENT WRITTEN ON 06/19/2023  2:54 PM BY Denetra Formoso ANNE, MD  Has received her hearing aids which are working well.

## 2023-06-19 NOTE — Patient Instructions (Signed)

## 2023-06-19 NOTE — Progress Notes (Signed)
Subjective:   Gwendolyn Perez is a 77 y.o. female who presents for an Initial Medicare Annual Wellness Visit.  Visit Complete: In person   Review of Systems    Defer to PCP.        Objective:    Today's Vitals   06/19/23 1522 06/19/23 1523  BP: 136/88   Pulse: 60   Temp: 98.1 F (36.7 C)   TempSrc: Oral   SpO2: 97%   Weight: 151 lb 4.8 oz (68.6 kg)   Height: 5\' 4"  (1.626 m)   PainSc:  2    Body mass index is 25.97 kg/m.     06/19/2023    3:28 PM 06/19/2023   10:09 AM 01/09/2023    8:52 AM 12/10/2022   11:06 AM 10/20/2019    8:59 PM  Advanced Directives  Does Patient Have a Medical Advance Directive? No Yes Yes No;Yes Yes  Type of Estate agent of [place];Living will Healthcare Power of [place];Living will Living will;Healthcare Power of [employer] Power of [place];Living will Healthcare Power of [place];Living will  Does patient want to make changes to medical advance directive?  No - Patient declined No - Patient declined No - Patient declined   Copy of Healthcare Power of Attorney in Chart? No - copy requested No - copy requested No - copy requested No - copy requested   Would patient like information on creating a medical advance directive? No - Patient declined  No - Patient declined No - Patient declined     Current Medications (verified) Outpatient Encounter Medications as of 06/19/2023  Medication Sig   acetaminophen (TYLENOL) 500 MG tablet 2 tablets as needed   betamethasone valerate ointment (VALISONE) 0.1 % Apply 1 Application topically 2 (two) times daily. (Patient not taking: Reported on 06/11/2023)   Cholecalciferol (VITAMIN D3) 1000 units CAPS Take 1 capsule by mouth daily.   DULoxetine (CYMBALTA) 60 MG capsule Take 1 capsule (60 mg total) by mouth daily.   estradiol (ESTRACE) 0.1 MG/GM vaginal cream Place 1 Applicatorful vaginally daily.   levothyroxine (SYNTHROID) 25 MCG tablet    omeprazole (PRILOSEC) 20 MG capsule Take 1  capsule (20 mg total) by mouth daily.   PFIZER COVID-19 VAC BIVALENT injection  (Patient not taking: Reported on 06/11/2023)   valACYclovir (VALTREX) 1000 MG tablet Take 0.5 tablets (500 mg total) by mouth daily as needed (herpes stomatitis).   No facility-administered encounter medications on file as of 06/19/2023.    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,coxic, neck, hip, knees   GERD (gastroesophageal reflux disease) March 2020   Hiatal hernia   Healthcare maintenance 11/22/2022   Heart murmur Birth   Functional heart murmur   Hypercholesterolemia 02/20/2022   Obstructive sleep apnea (adult) (pediatric) 02/20/2022   Thyroid disease Hypothyroidism   Mildly affected   Past Surgical History:  Procedure Laterality Date   ABDOMINAL HYSTERECTOMY  2000   By laparoscopic surgery   Family History  Problem Relation Age of Onset   Healthy Mother    Cancer Father    Hearing loss Paternal Grandfather    Arthritis Paternal Grandmother    Arthritis Paternal Aunt    Hearing loss Paternal Aunt    Social History   Socioeconomic History   Marital status: Married    Spouse name: Not on file   Number of children: Not on file   Years of  education: Not on file   Highest education level: Not on file  Occupational History   Not on file  Tobacco Use   Smoking status: Never    Passive exposure: Never   Smokeless tobacco: Never  Vaping Use   Vaping status: Never Used  Substance and Sexual Activity   Alcohol use: Yes    Alcohol/week: 2.0 standard drinks of alcohol    Types: 2 Glasses of wine per week    Comment: Socially when with friends   Drug use: Never   Sexual activity: Yes    Comment: Hysterectomy  Other Topics Concern   Not on file  Social History Narrative   Married   Right handed   Caffeine- 2 cups in the morning   Social Determinants of Health   Financial Resource  Strain: Low Risk  (06/19/2023)   Overall Financial Resource Strain (CARDIA)    Difficulty of Paying Living Expenses: Not hard at all  Food Insecurity: No Food Insecurity (06/19/2023)   Hunger Vital Sign    Worried About Running Out of Food in the Last Year: Never true    Ran Out of Food in the Last Year: Never true  Transportation Needs: No Transportation Needs (06/19/2023)   PRAPARE - Administrator, Civil Service (Medical): No    Lack of Transportation (Non-Medical): No  Physical Activity: Inactive (06/19/2023)   Exercise Vital Sign    Days of Exercise per Week: 0 days    Minutes of Exercise per Session: 0 min  Stress: No Stress Concern Present (06/19/2023)   [employer] of Occupational Health - Occupational Stress Questionnaire    Feeling of Stress : Not at all  Social Connections: Socially Integrated (06/19/2023)   Social Connection and Isolation Panel [NHANES]    Frequency of Communication with Friends and Family: More than three times a week    Frequency of Social Gatherings with Friends and Family: More than three times a week    Attends Religious Services: More than 4 times per year    Active Member of [employer] or Organizations: Yes    Attends Engineer, structural: More than 4 times per year    Marital Status: Living with partner    Tobacco Counseling Counseling given: Not Answered   Clinical Intake:  Pre-visit preparation completed: Yes  Pain : 0-10 Pain Score: 2  Pain Location: Head     BMI - recorded: 25.97 Nutritional Status: BMI 25 -29 Overweight Nutritional Risks: None Diabetes: No  How often do you need to have someone help you when you read instructions, pamphlets, or other written materials from your doctor or pharmacy?: 1 - Never What is the last grade level you completed in school?: COLLEGE  Interpreter Needed?: No  Information entered by :: Shakti Fleer,CMA 06/19/2023   Activities of Daily Living    06/19/2023    3:24 PM  06/19/2023    9:58 AM  In your present state of health, do you have any difficulty performing the following activities:  Hearing? 0 0  Vision? 0 0  Difficulty concentrating or making decisions? 0 0  Walking or climbing stairs? 0 0  Dressing or bathing? 0 0  Doing errands, shopping? 0 0    Patient Care Team: Miguel Aschoff, MD as PCP - General (Internal Medicine)  Indicate any recent Medical Services you may have received from other than [facility] providers in the past year (date may be approximate).     Assessment:   This is  a routine wellness examination for [place].  Hearing/Vision screen No results found.  Dietary issues and exercise activities discussed:     Goals Addressed   None   Depression Screen    06/19/2023    3:27 PM 06/19/2023   10:15 AM 01/09/2023    8:52 AM 12/10/2022   11:08 AM 11/28/2022    3:51 PM 02/28/2022    8:17 PM  PHQ 2/9 Scores  PHQ - 2 Score 0 0 0 0 0   PHQ- 9 Score    0 0   Exception Documentation      Patient refusal    Fall Risk    06/19/2023    3:28 PM 06/19/2023    9:58 AM 01/09/2023    8:52 AM 12/10/2022   11:08 AM 11/28/2022    3:51 PM  Fall Risk   Falls in the past year? 0 0 0 0 0  Number falls in past yr: 0 0 0    Injury with Fall? 0 0 0    Risk for fall due to : No Fall Risks   No Fall Risks No Fall Risks  Follow up Falls evaluation completed Falls evaluation completed Falls evaluation completed Falls evaluation completed Falls evaluation completed    MEDICARE RISK AT HOME:  Medicare Risk at Home - 06/19/23 1528     Any stairs in or around the home? Yes    If so, are there any without handrails? No    Home free of loose throw rugs in walkways, pet beds, electrical cords, etc? Yes    Adequate lighting in your home to reduce risk of falls? Yes    Life alert? No    Use of a cane, walker or w/c? No    Grab bars in the bathroom? Yes    Shower chair or bench in shower? No    Elevated toilet seat or a handicapped toilet? No              TIMED UP AND GO:  Was the test performed? No    Cognitive Function:        Immunizations Immunization History  Administered Date(s) Administered   Fluad Quad(high Dose 65+) 09/03/2022   Influenza Split 01/20/2017, 09/08/2017, 07/12/2019   Influenza, High Dose Seasonal PF 09/15/2018, 06/23/2020   Influenza-Unspecified 09/08/2020, 09/08/2021   PFIZER(Purple Top)SARS-COV-2 Vaccination 12/01/2019, 12/20/2019, 09/02/2020, 03/08/2021   PNEUMOCOCCAL CONJUGATE-20 11/14/2022   Pneumococcal Conjugate-13 01/13/2018   Pneumococcal Polysaccharide-23 01/25/2019   Pneumococcal-Unspecified 09/08/2020, 09/19/2021   Td 10/09/2018   Unspecified SARS-COV-2 Vaccination 11/13/2021   Zoster Recombinant(Shingrix) 12/16/2018   Zoster, Live 12/09/2013, 08/10/2018, 12/09/2018    TDAP status: Up to date  Flu Vaccine status: Up to date  Pneumococcal vaccine status: Up to date  Covid-19 vaccine status: Information provided on how to obtain vaccines.   Qualifies for Shingles Vaccine? Yes   Zostavax completed No   Shingrix Completed?: No.    Education has been provided regarding the importance of this vaccine. Patient has been advised to call insurance company to determine out of pocket expense if they have not yet received this vaccine. Advised may also receive vaccine at local pharmacy or Health Dept. Verbalized acceptance and understanding.  Screening Tests Health Maintenance  Topic Date Due   Hepatitis C Screening  Never done   Colonoscopy  Never done   DEXA SCAN  Never done   Zoster Vaccines- Shingrix (2 of 2) 02/10/2019   COVID-19 Vaccine (6 - 2023-24 season) 08/09/2022   INFLUENZA  VACCINE  07/10/2023   Medicare Annual Wellness (AWV)  06/18/2024   DTaP/Tdap/Td (2 - Tdap) 10/09/2028   Pneumonia Vaccine 48+ Years old  Completed   HPV VACCINES  Aged Out    Health Maintenance  Health Maintenance Due  Topic Date Due   Hepatitis C Screening  Never done   Colonoscopy  Never done    DEXA SCAN  Never done   Zoster Vaccines- Shingrix (2 of 2) 02/10/2019   COVID-19 Vaccine (6 - 2023-24 season) 08/09/2022    Colorectal cancer screening: Defer to PCP.    Lung Cancer Screening: (Low Dose CT Chest recommended if Age 75-80 years, 20 pack-year currently smoking OR have quit w/in 15years.) does not qualify.   Lung Cancer Screening Referral: Defer to PCP.   Additional Screening:  Hepatitis C Screening: does qualify; Completed Defer to PCP.   Vision Screening: Recommended annual ophthalmology exams for early detection of glaucoma and other disorders of the eye. Is the patient up to date with their annual eye exam?  Yes  Who is the provider or what is the name of the office in which the patient attends annual eye exams? [facility]  If pt is not established with a provider, would they like to be referred to a provider to establish care? No .   Dental Screening: Recommended annual dental exams for proper oral hygiene   Community Resource Referral / Chronic Care Management: CRR required this visit?  No   CCM required this visit?  No     Plan:     I have personally reviewed and noted the following in the patient's chart:   Medical and social history Use of alcohol, tobacco or illicit drugs  Current medications and supplements including opioid prescriptions. Patient is not currently taking opioid prescriptions. Functional ability and status Nutritional status Physical activity Advanced directives List of other physicians Hospitalizations, surgeries, and ER visits in previous 12 months Vitals Screenings to include cognitive, depression, and falls Referrals and appointments  In addition, I have reviewed and discussed with patient certain preventive protocols, quality metrics, and best practice recommendations. A written personalized care plan for preventive services as well as general preventive health recommendations were provided to patient.      Soumya Colson, CMA   06/19/2023   After Visit Summary: (Mail) Due to this being a telephonic visit, the after visit summary with patients personalized plan was offered to patient via mail   Nurse Notes: Face to Face.  Ms. Partridge , Thank you for taking time to come for your Medicare Wellness Visit. I appreciate your ongoing commitment to your health goals. Please review the following plan we discussed and let me know if I can assist you in the future.   These are the goals we discussed:  Goals   None     This is a list of the screening recommended for you and due dates:  Health Maintenance  Topic Date Due   Hepatitis C Screening  Never done   Colon Cancer Screening  Never done   DEXA scan (bone density measurement)  Never done   Zoster (Shingles) Vaccine (2 of 2) 02/10/2019   COVID-19 Vaccine (6 - 2023-24 season) 08/09/2022   Flu Shot  07/10/2023   Medicare Annual Wellness Visit  06/18/2024   DTaP/Tdap/Td vaccine (2 - Tdap) 10/09/2028   Pneumonia Vaccine  Completed   HPV Vaccine  Aged Out

## 2023-06-19 NOTE — Assessment & Plan Note (Signed)
Lizzy and her husband have had a ball with their new camper!  They have even been out dancing.  Her next goal is to work on exercise and weight loss, including walking and weightlifting.  She is Building control surveyor a Psychologist, occupational.  They have tried Silver sneakers and SAGE well.

## 2023-06-19 NOTE — Progress Notes (Signed)
[name] no is here for routine follow-up together with her husband and they are delighted to share that they are doing great!  They purchased a pole behind camper and have been exploring all over [place] and are having a great time.  They have even been swing dancing!  She is feeling well.  Her greatest concern is a recent Lee elevated blood pressure when she was at her dentist office, never before a problem, always experiencing SBP's less than 120 for the most part.  Was not under stress.  Recent sleep evaluation revealed no OSA.  She does not drink alcohol and does not use NSAIDs.  Her diet, however, does have considerable sodium.  She inquires about a general surgery referral for her internal hemorrhoid, she is ready to proceed.  Has been evaluated by Dr. Terrace Arabia for a cervical nerve pain which has been bothering her.  She is taking duloxetine 60 mg daily.  Otherwise doing quite well and continues to be quite active and full of energy.  All medications reviewed and discussed.  BP 136/88 (BP Location: Left Arm, Patient Position: Sitting, Cuff Size: Small)   Pulse 60   Temp 98.1 F (36.7 C) (Oral)   Ht 5\' 4"  (1.626 m)   Wt 151 lb 4.8 oz (68.6 kg)   SpO2 97%   BMI 25.97 kg/m  Recheck BP was higher.  Beautiful youthful appearance.  Moves briskly and smoothly. Heart RRR, no murmurs or extra heart sounds.  Lungs clear to auscultation throughout.  No carotid bruits.  Radial pulses symmetric and strong.  Right DP 2+, left DP 1+.  Hands and feet are well-perfused.  Healing superficial ecchymosis is noted on the left forearm (traumatic).  Right ear canal is clear, left occluded with pale soft appearing cerumen.  Problems, assessment and plan:  Internal hemorrhoid Prolapsing internal hemorrhoid is minimally symptomatic but she is ready to have it addressed.  Referral to [facility] made.  BMs are regular and easy to pass.  Hiatal hernia with gastroesophageal reflux Symptoms controlled with OTC  omeprazole 20 mg daily.  Definitely an association with what she eats, and she lies down after meals on occasion.  We discussed desire to avoid PPIs long-term due to risk for osteoporosis.  In the future may we may entertain changing to H2Bs  Hashimoto's thyroiditis Followed by Dr. Sharl Ma who prescribes Synthroid.  Cerumen impaction Left ear occluded today but inadequate time to flush.  We will do this another time.  Hearing impairment Has received her hearing aids which are working well.  Snores Sleep test negative for OSA.  Will resolve problem.  Elevated blood pressure reading Readings have gradually increased over the past few months.  No increased stress, OSA, no alcohol or NSAIDs.  Diet is not low-salt, which will be addressed first.  She will return in 3 months for recheck.  Goals of care, counseling/discussion [name] and her husband have had a ball with their new camper!  They have even been out dancing.  Her next goal is to work on exercise and weight loss, including walking and weightlifting.  She is Building control surveyor a Psychologist, occupational.  They have tried Silver sneakers and SAGE well.

## 2023-06-19 NOTE — Assessment & Plan Note (Signed)
Readings have gradually increased over the past few months.  No increased stress, OSA, no alcohol or NSAIDs.  Diet is not low-salt, which will be addressed first.  She will return in 3 months for recheck.

## 2023-06-19 NOTE — Patient Instructions (Addendum)
Mrs. Messman,  So awesome to see you and your handsome hubby having so much fun!  It makes me smile.  Today we referred you for an internal hemorrhoid consultation and worked on some refills.  Our new issue is your increasing blood pressure.  The first intervention is to start limiting your use of salt.  This may do the trick.  If not, we'll discuss a low dose of medicine to help keep it under control.  It's not the end of the world!  We will keep a close eye on this and I'd like to see you in 3 months to see where things stand.  Take care and stay well,  Dr. Mayford Knife

## 2023-06-19 NOTE — Assessment & Plan Note (Signed)
Symptoms controlled with OTC omeprazole 20 mg daily.  Definitely in association with what she eats, and she lies down after meals on occasion.  We discussed desire to avoid PPIs long-term due to risk for osteoporosis.  In the future may we may entertain changing to H2Bs

## 2023-06-26 ENCOUNTER — Telehealth: Payer: Self-pay | Admitting: Neurology

## 2023-06-26 ENCOUNTER — Ambulatory Visit: Payer: Medicare Other | Admitting: Neurology

## 2023-06-26 ENCOUNTER — Encounter: Payer: Self-pay | Admitting: Neurology

## 2023-06-26 VITALS — BP 124/78 | HR 67 | Ht 64.0 in | Wt 149.0 lb

## 2023-06-26 DIAGNOSIS — M5412 Radiculopathy, cervical region: Secondary | ICD-10-CM

## 2023-06-26 DIAGNOSIS — R202 Paresthesia of skin: Secondary | ICD-10-CM | POA: Diagnosis not present

## 2023-06-26 MED ORDER — GABAPENTIN 300 MG PO CAPS: 300.0000 mg | ORAL_CAPSULE | Freq: Three times a day (TID) | ORAL | 11 refills | Status: DC | PRN

## 2023-06-26 NOTE — Telephone Encounter (Signed)
Referral faxed to [facility]: Phone: [phone]   Fax: [phone]

## 2023-06-26 NOTE — Telephone Encounter (Signed)
Referral faxed to [facility]: Phone: [phone]  Fax:[phone] 

## 2023-06-26 NOTE — Progress Notes (Signed)
Chief Complaint  Patient presents with   Follow-up    Rm14, husband present (pete) Paresthesia Neck pain Left lumbar pain Cervicalgia: pt stated that she is good except for a pinched nerve in her neck pain currently is a 3/10 and it starts at back of neck that radiates upwards to back of left eye        ASSESSMENT AND PLAN  Gwendolyn Perez is a 77 y.o. female  Neck pain, radiating pain to left upper extremity,  Most worrisome for left cervical radiculopathy  EMG nerve conduction study  MRI of cervical spine  Gabapentin 300 mg up to 3 tablets daily  She has been bothered by her left neck pain for while, desire, epidural injection referral to [facility]   Bilateral lower extremity paresthesia, urged to move,  Much improved with Cymbalta 60 mg daily    DIAGNOSTIC DATA (LABS, IMAGING, TESTING) - I reviewed patient records, labs, notes, testing and imaging myself where available.   MEDICAL HISTORY:  Gwendolyn Perez is a 77 year old female, accompanied by her husband, and daughter Selena Batten, seen in request by her primary care doctor Dr. Modesto Charon, Thelma Barge for episode of falling, initial evaluation was on March 12, 2022  I reviewed and summarized the referring note. PMHX. 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, when she just came back from a call, she received another call, in a standing up position tried to put on her pants, she fell backwards,  She complains of lower back pain from impact, but  otherwise denies sensory loss of bilateral upper or lower extremity, denies weakness, denies bowel and bladder incontinence  She denies warning signs prior to falling, no chest pain, no heart palpitation, denied loss of consciousness,  There is no orthostatic blood pressure change on today's examination,  UPDATE Dec 26 2022: She is accompanied by her husband at today's clinical visit, has no recurrent falling episode, continue to work as a Orthoptist at [employer], denied difficulty walking, but over the past 6 months, she has frequent neck pain, wake up in the morning often feel neck muscle tension, if she lie on the left side, noticed radiating pain, paresthesia to left upper extremity and left hand, happen to the right side if she lie on the right side  Husband also reported that she has a lot of loud snoring, she complains of diffuse body achy pain, extreme fatigue during the day, she denies bowel or bladder incontinence  Also has left lower back pain, radiating pain to left lower extremity,  She has many years history of body tenderness upon deep palpitation, previously carried a diagnosis of restless leg syndrome, urged to move her leg, continue to have bilateral feet numbing, tingling itching sensation, urged to move her leg while lying still  UPDATE June 26 2023: She is accompanied by her husband at today's clinical visit, today her main concern is slow worsening left-sided neck pain, radiating pain to left shoulder,  left hand numbness, getting numb by using her left arm for just few minutes,  She denies gait abnormality, denies bowel and bladder incontinence   Had sleep study May 2024, no evidence of obstructive sleep apnea, fragmentation abnormal sleep stage percentage,  Personally reviewed MRI of cervical spine, multilevel degenerative changes, most noticeable at C5-6, moderate left foraminal narrowing, no significant canal stenosis  MRI of lumbar spine, mild degenerative changes, no  significant canal foraminal narrowing, most noticeable L4-5, with moderate left lateral recess stenosis  Extensive laboratory evaluation January 2024 normal CPK ferritin B12 RPR protein electrophoresis A1c CMP CBC ESR ANA TSH  PHYSICAL EXAM:   Vitals:   06/26/23 0957  BP: 124/78  Pulse: 67  Weight: 149 lb (67.6 kg)  Height: 5\' 4"  (1.626 m)   Body mass index is 25.58 kg/m.  PHYSICAL EXAMNIATION:  Gen: NAD, conversant, well nourised, well groomed                     Cardiovascular: Regular rate rhythm, no peripheral edema, warm, nontender. Eyes: Conjunctivae clear without exudates or hemorrhage Neck: Supple, no carotid bruits. Pulmonary: Clear to auscultation bilaterally   NEUROLOGICAL EXAM:  MENTAL STATUS: Speech/cognition: Awake, alert, oriented to history taking and casual conversation   CRANIAL NERVES: CN II: Visual fields are full to confrontation. Pupils are round equal and briskly reactive to light. CN III, IV, VI: extraocular movement are normal. No ptosis. CN V: Facial sensation is intact to light touch CN VII: Face is symmetric with normal eye closure  CN VIII: Hearing is normal to causal conversation. CN IX, X: Phonation is normal. CN XI: Head turning and shoulder shrug are intact  MOTOR: There is no pronator drift of out-stretched arms. Muscle bulk and tone are normal. Muscle strength is normal.  REFLEXES: Reflexes are 2  and symmetric at the biceps, triceps, knees, and ankles. Plantar responses are flexor.  SENSORY: Intact to light touch, pinprick and vibratory sensation are intact in fingers and toes.  COORDINATION: There is no trunk or limb dysmetria noted.  GAIT/STANCE: Posture is normal. Gait is steady with normal steps, base, arm swing, and turning. Heel and toe walking are normal.  Slight difficulty with tandem walking is expected with her age.  REVIEW OF SYSTEMS:  Full 14 system review of systems performed and notable only for as above All  other review of systems were negative.   ALLERGIES: Allergies  Allergen Reactions   Black Cohosh Rash   Sulfa Antibiotics Rash    HOME MEDICATIONS: Current Outpatient Medications  Medication Sig Dispense Refill   acetaminophen (TYLENOL) 500 MG tablet 2 tablets as needed     betamethasone valerate ointment (VALISONE) 0.1 % Apply 1 Application topically 2 (two) times daily. 30 g 0   Cholecalciferol (VITAMIN D3) 1000 units CAPS Take 1 capsule by mouth daily.     DULoxetine (CYMBALTA) 60 MG capsule Take 1 capsule (60 mg total) by mouth daily. 90 capsule 2   estradiol (ESTRACE) 0.1 MG/GM vaginal cream Place 1 Applicatorful vaginally daily.     levothyroxine (SYNTHROID) 25 MCG tablet      omeprazole (PRILOSEC) 20 MG capsule Take 1 capsule (20 mg total) by mouth daily. 90 capsule 3   PFIZER COVID-19 VAC BIVALENT injection      valACYclovir (VALTREX) 1000 MG tablet Take 0.5 tablets (500 mg total) by mouth daily as needed (herpes stomatitis). 30 tablet 5   No current facility-administered medications for this visit.    PAST  MEDICAL HISTORY: Past Medical History:  Diagnosis Date   Arthritis 10 yrs ago   Thumbs,coxic, neck, hip, knees   GERD (gastroesophageal reflux disease) March 2020   Hiatal hernia   Healthcare maintenance 11/22/2022   Heart murmur Birth   Functional heart murmur   Hypercholesterolemia 02/20/2022   Obstructive sleep apnea (adult) (pediatric) 02/20/2022   Thyroid disease Hypothyroidism   Mildly affected    PAST SURGICAL HISTORY: Past Surgical History:  Procedure Laterality Date   ABDOMINAL HYSTERECTOMY  2000   By laparoscopic surgery    FAMILY HISTORY: Family History  Problem Relation Age of Onset   Healthy Mother    Cancer Father    Hearing loss Paternal Grandfather    Arthritis Paternal Grandmother    Arthritis Paternal Aunt    Hearing loss Paternal Aunt     SOCIAL HISTORY: Social History   Socioeconomic History   Marital status: Married     Spouse name: pete   Number of children: 4   Years of education: Not on file   Highest education level: Master's degree (e.g., MA, MS, MEng, MEd, MSW, MBA)  Occupational History   Not on file  Tobacco Use   Smoking status: Never    Passive exposure: Never   Smokeless tobacco: Never  Vaping Use   Vaping status: Never Used  Substance and Sexual Activity   Alcohol use: Yes    Alcohol/week: 4.0 standard drinks of alcohol    Types: 4 Glasses of wine per week    Comment: Socially when with friends   Drug use: Never   Sexual activity: Not Currently    Comment: Hysterectomy  Other Topics Concern   Not on file  Social History Narrative   Married   Right handed   Caffeine- 2 cups in the morning   Social Determinants of Health   Financial Resource Strain: Low Risk  (06/19/2023)   Overall Financial Resource Strain (CARDIA)    Difficulty of Paying Living Expenses: Not hard at all  Food Insecurity: No Food Insecurity (06/19/2023)   Hunger Vital Sign    Worried About Running Out of Food in the Last Year: Never true    Ran Out of Food in the Last Year: Never true  Transportation Needs: No Transportation Needs (06/19/2023)   PRAPARE - Administrator, Civil Service (Medical): No    Lack of Transportation (Non-Medical): No  Physical Activity: Inactive (06/19/2023)   Exercise Vital Sign    Days of Exercise per Week: 0 days    Minutes of Exercise per Session: 0 min  Stress: No Stress Concern Present (06/19/2023)   [employer] of Occupational Health - Occupational Stress Questionnaire    Feeling of Stress : Not at all  Social Connections: Socially Integrated (06/19/2023)   Social Connection and Isolation Panel [NHANES]    Frequency of Communication with Friends and Family: More than three times a week    Frequency of Social Gatherings with Friends and Family: More than three times a week    Attends Religious Services: More than 4 times per year    Active Member of [employer] or  Organizations: Yes    Attends Banker Meetings: More than 4 times per year    Marital Status: Living with partner  Intimate Partner Violence: Not At Risk (06/19/2023)   Humiliation, Afraid, Rape, and Kick questionnaire    Fear of Current or Ex-Partner: No    Emotionally Abused: No    Physically Abused: No  Sexually Abused: No      Levert Feinstein, M.D. Ph.D.  [facility] [address], [address] [place], [place] [address] Ph: [phone] Fax: [phone]  CC:  Miguel Aschoff, MD [address] [place],  [place] [address]  Miguel Aschoff, MD

## 2023-07-10 DIAGNOSIS — M542 Cervicalgia: Secondary | ICD-10-CM | POA: Diagnosis not present

## 2023-07-11 ENCOUNTER — Other Ambulatory Visit: Payer: Self-pay

## 2023-07-11 ENCOUNTER — Ambulatory Visit: Payer: Medicare Other | Attending: Internal Medicine

## 2023-07-11 DIAGNOSIS — M542 Cervicalgia: Secondary | ICD-10-CM | POA: Diagnosis not present

## 2023-07-11 DIAGNOSIS — M5412 Radiculopathy, cervical region: Secondary | ICD-10-CM | POA: Diagnosis not present

## 2023-07-11 NOTE — Therapy (Signed)
OUTPATIENT PHYSICAL THERAPY CERVICAL EVALUATION   Patient Name: Gwendolyn Perez MRN: [id number] DOB:05/22/1946, 77 y.o., female Today's Date: 07/11/2023  END OF SESSION:  PT End of Session - 07/11/23 1303     Visit Number 1    Number of Visits 9    Date for PT Re-Evaluation 09/05/23    PT Start Time 1015    PT Stop Time 1100    PT Time Calculation (min) 45 min    Activity Tolerance Patient tolerated treatment well    Behavior During Therapy [facility] for tasks assessed/performed             Past Medical History:  Diagnosis Date   Arthritis 10 yrs ago   Thumbs,coxic, neck, hip, knees   GERD (gastroesophageal reflux disease) March 2020   Hiatal hernia   Healthcare maintenance 11/22/2022   Heart murmur Birth   Functional heart murmur   Hypercholesterolemia 02/20/2022   Obstructive sleep apnea (adult) (pediatric) 02/20/2022   Thyroid disease Hypothyroidism   Mildly affected   Past Surgical History:  Procedure Laterality Date   ABDOMINAL HYSTERECTOMY  2000   By laparoscopic surgery   Patient Active Problem List   Diagnosis Date Noted   Left cervical radiculopathy 06/26/2023   Elevated blood pressure reading 06/19/2023   Paresthesia 12/26/2022   Cervicalgia 12/26/2022   Left lumbar pain 12/26/2022   Healthcare maintenance 11/22/2022   Goals of care, counseling/discussion 11/22/2022   Hiatal hernia with gastroesophageal reflux 11/14/2022   Internal hemorrhoid 11/14/2022   Leg length discrepancy 11/14/2022   Hearing impairment 11/14/2022   Cerumen impaction 11/14/2022   Multiple falls in March 2023 02/28/2022   Hashimoto's thyroiditis 02/20/2022   Multiple thyroid nodules 02/20/2022   Osteopenia 02/20/2022   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   Hypercholesterolemia 02/20/2022   Benign paroxysmal positional vertigo 02/20/2022   Osteoarthritis, localized, hand, unspecified laterality 08/22/2020   Hip pain 08/22/2020    PCP:    REFERRING PROVIDER: Levert Feinstein, MD  REFERRING  DIAG: M54.12 (ICD-10-CM) - Left cervical radiculopathy  THERAPY DIAG:  Neck pain  Radiculopathy, cervical region  Rationale for Evaluation and Treatment: Rehabilitation  ONSET DATE: 06/26/2023  SUBJECTIVE:                                                                                                                                                                                                         SUBJECTIVE STATEMENT: Pt reports of chronic neck pain for about 5 years. Pt reports holding the phone in front will cause numbness in her fingers. Pt denies constant numbness or  tingling or radiation of pain in to her L arm. Pt reports of significant neck pain in the morning which gets better as the day goes on. Hand dominance: Right  PERTINENT HISTORY:  OA, GERD  PAIN:  Are you having pain? Yes: NPRS scale: 7-8/10 Pain location: Left side of the neck Pain description: sharp, achy Aggravating factors: sleeping, turning head, holding the phone Relieving factors: rest, gabapentin  PRECAUTIONS: None  RED FLAGS: None     WEIGHT BEARING RESTRICTIONS: No  FALLS:  Has patient fallen in last 6 months? 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 At C5-C6, there are degenerative changes causing moderate left foraminal narrowing encroaching upon the left C6 nerve root though there does not appear to be nerve root compression. Milder degenerative  changes at C3-C4, C6-C7 and C7-T1 do not lead to spinal stenosis or nerve root compression.  PATIENT SURVEYS:  NDI 18%  COGNITION: Overall cognitive status: Within functional limits for tasks assessed   CERVICAL ROM:   Active ROM A/PROM (deg) eval  Flexion 70  Extension 60  Right lateral flexion 25  Left lateral flexion 25  Right rotation 70  Left rotation 55   (Blank rows = not tested)  UPPER EXTREMITY ROM:  Active ROM Right eval Left eval  Shoulder flexion    Shoulder extension    Shoulder abduction    Shoulder adduction    Shoulder extension    Shoulder internal rotation    Shoulder external rotation    Elbow flexion    Elbow extension    Wrist flexion    Wrist extension    Wrist ulnar deviation    Wrist radial deviation    Wrist pronation    Wrist supination     (Blank rows = not tested)  UPPER EXTREMITY MMT:  MMT Right eval Left eval  Shoulder flexion    Shoulder extension    Shoulder abduction    Shoulder adduction    Shoulder extension    Shoulder internal rotation    Shoulder external rotation    Middle trapezius    Lower trapezius    Elbow flexion    Elbow extension    Wrist flexion    Wrist extension    Wrist ulnar deviation    Wrist radial deviation    Wrist pronation    Wrist supination    Grip strength 53 lbs 50 lbs   (Blank rows = not tested)   TODAY'S TREATMENT:  DATE:  Patient educated verbally and demonstrated on sleeping positions: - supine: use thinner pillow and tuck in the neck, maintain neutral posture, avoid having pillow under shoulders, 2 pillows under knees to reduce neural tension - sidelying: thicker feather pillow or two thin pillows.  Maintain neutral neck position, tucking the side of the pillow in to her neck to improve support, avoid having pillow in the shoulder, slight posterior lean to  avoid direct pressure on bottom shoulder, using pillow to wedge the back to improve and support, pillow between the knees.  Pt tested positive with ulnar nerve tension with provocation of sensory symptoms in L UE. Negative with radial and median nerve testing.   Manual therapy: Instrument assisted soft tissue mobilization to medial elbow over ulnar nerve distribution <5' Pt demonstrated on how to perform ulnar nerve self mobilization: 20x, pt educated on avoiding exaggeration of symptoms but controlling her stretch.   PATIENT EDUCATION:  Education details: see above Person educated: Patient Education method: Explanation Education comprehension: verbalized understanding  HOME EXERCISE PROGRAM: Access Code: [facility] URL: [email or web address] Date: 07/11/2023 Prepared by: Lavone Nian  Exercises - Standing Ulnar Nerve Glide  - 3-5 x daily - 7 x weekly - 1 sets - 10 reps  ASSESSMENT:  CLINICAL IMPRESSION: Patient is a 77 y.o. female who was seen today for physical therapy evaluation and treatment for chronic neck pain with intermittent cervical radiculopathy in her L arm. Objective impairments include increased neural tension in L ulnar nerve, decreased cervical rotation to left, and pain. These limitation are preventing patients from reading books, using her phone for prolonged periods of time (>10 min due to sensory symptoms in her L UE), sleeping and driving. Patient will benefit from skilled PT to address these symptoms and improve overall function .  OBJECTIVE IMPAIRMENTS: decreased ROM, hypomobility, increased fascial restrictions, impaired flexibility, impaired sensation, and pain.   ACTIVITY LIMITATIONS: carrying and driving, sleeping  PARTICIPATION LIMITATIONS: cleaning, driving, and sleeping  PERSONAL FACTORS: Time since onset of injury/illness/exacerbation are also affecting patient's functional outcome.   REHAB POTENTIAL: Excellent  CLINICAL DECISION  MAKING: Stable/uncomplicated  EVALUATION COMPLEXITY: Low   GOALS: Goals reviewed with patient? Yes  SHORT TERM GOALS: Target date: 08/08/2023    Pt will be I and compliant with HEP to self manage her symptoms Baseline: Initiated HEP today. Goal status: INITIAL  2.  Pt will report 50% improvement in her neck pain and radiculopathy symptoms to improve overall function.  Baseline: neck pain in morning 7-8/10, radiculopathy in L hand after 10 min of holding the phone Goal status: INITIAL      LONG TERM GOALS: Target date: 09/05/2023    Pt will demo 70 deg of cervical rotation to left to improve driving without pain Baseline: 55 deg L rotation (07/11/23) Goal status: INITIAL  2.  Pt will be able to read a book or hold the phone for up to 45 min before she experiences any radiculopathy in her left hand.  Baseline: ~10 min before it starts (07/11/23) Goal status: INITIAL    PLAN:  PT FREQUENCY: 1x/week  PT DURATION: 8 weeks  PLANNED INTERVENTIONS: Therapeutic exercises, Therapeutic activity, Neuromuscular re-education, Balance training, Gait training, Patient/Family education, Self Care, Joint mobilization, Dry Needling, Electrical stimulation, Spinal mobilization, Cryotherapy, Moist heat, Manual therapy, and Re-evaluation  PLAN FOR NEXT SESSION: Review ulnar nerve self mobilization, address left cervical hypomobility with rotation   Ileana Ladd, PT 07/11/2023, 1:40 PM

## 2023-07-16 ENCOUNTER — Other Ambulatory Visit: Payer: Self-pay

## 2023-07-16 ENCOUNTER — Ambulatory Visit: Payer: Medicare Other

## 2023-07-16 DIAGNOSIS — M5412 Radiculopathy, cervical region: Secondary | ICD-10-CM | POA: Diagnosis not present

## 2023-07-16 DIAGNOSIS — M542 Cervicalgia: Secondary | ICD-10-CM | POA: Diagnosis not present

## 2023-07-16 NOTE — Therapy (Signed)
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 ago   Thumbs,coxic, neck, hip, knees   GERD (gastroesophageal reflux disease) March 2020   Hiatal hernia   Healthcare maintenance 11/22/2022   Heart murmur Birth   Functional heart murmur   Hypercholesterolemia 02/20/2022   Obstructive sleep apnea (adult) (pediatric) 02/20/2022   Thyroid disease Hypothyroidism   Mildly affected   Past Surgical History:  Procedure Laterality Date   ABDOMINAL HYSTERECTOMY  2000   By laparoscopic surgery   Patient Active Problem List   Diagnosis Date Noted   Left cervical radiculopathy 06/26/2023   Elevated blood pressure reading 06/19/2023   Paresthesia 12/26/2022   Cervicalgia 12/26/2022   Left lumbar pain 12/26/2022   Healthcare maintenance 11/22/2022   Goals of care, counseling/discussion 11/22/2022   Hiatal hernia with gastroesophageal reflux 11/14/2022   Internal hemorrhoid 11/14/2022   Leg length discrepancy 11/14/2022   Hearing impairment 11/14/2022   Cerumen impaction 11/14/2022   Multiple falls in March 2023 02/28/2022   Hashimoto's thyroiditis 02/20/2022   Multiple thyroid nodules 02/20/2022   Osteopenia 02/20/2022   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   Hypercholesterolemia 02/20/2022   Benign paroxysmal positional vertigo 02/20/2022   Osteoarthritis, localized, hand, unspecified laterality 08/22/2020   Hip pain 08/22/2020    PCP:    REFERRING PROVIDER: Levert Feinstein, MD  REFERRING DIAG: M54.12 (ICD-10-CM) - Left cervical radiculopathy  THERAPY DIAG:  Neck  pain  Radiculopathy, cervical region  Rationale for Evaluation and Treatment: Rehabilitation  ONSET DATE: 06/26/2023  SUBJECTIVE:                                                                                                                                                                                                         SUBJECTIVE STATEMENT: Pt reports neck pain is better. She is implementing pillow positioning and it is helping. Pt reports strained muscle in L forearm from doing the exercise. Hand dominance: Right  PERTINENT HISTORY:  OA, GERD  PAIN:  Are you having pain? Yes: NPRS scale: 7-8/10 Pain location: Left side  of the neck Pain description: sharp, achy Aggravating factors: sleeping, turning head, holding the phone Relieving factors: rest, gabapentin  PRECAUTIONS: None  RED FLAGS: None     WEIGHT BEARING RESTRICTIONS: No  FALLS:  Has patient fallen in last 6 months? 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 At C5-C6, there are degenerative changes causing moderate left foraminal narrowing encroaching upon the left C6 nerve root though there does not appear to be nerve root compression. Milder degenerative changes at C3-C4, C6-C7 and C7-T1 do not lead to spinal stenosis or nerve root compression.  PATIENT SURVEYS:  NDI 18%  COGNITION: Overall cognitive status: Within functional limits for tasks  assessed   CERVICAL ROM:   Active ROM A/PROM (deg) eval AROM 07/16/23  Flexion 70   Extension 60   Right lateral flexion 25   Left lateral flexion 25   Right rotation 70 60  Left rotation 55 65   (Blank rows = not tested)  UPPER EXTREMITY ROM:  Active ROM Right eval Left eval  Shoulder flexion    Shoulder extension    Shoulder abduction    Shoulder adduction    Shoulder extension    Shoulder internal rotation    Shoulder external rotation    Elbow flexion    Elbow extension    Wrist flexion    Wrist extension    Wrist ulnar deviation    Wrist radial deviation    Wrist pronation    Wrist supination     (Blank rows = not tested)  UPPER EXTREMITY MMT:  MMT Right eval Left eval  Shoulder flexion    Shoulder extension    Shoulder abduction    Shoulder adduction    Shoulder extension    Shoulder internal rotation    Shoulder external rotation    Middle trapezius    Lower trapezius    Elbow flexion    Elbow extension    Wrist flexion    Wrist extension    Wrist ulnar deviation    Wrist radial deviation    Wrist pronation    Wrist supination    Grip strength 53 lbs 50 lbs   (Blank rows = not tested)   TODAY'S TREATMENT:                                                                                                                              DATE:   Manual therapy: Instrument assisted soft tissue  mobilization to L wrist extensors tendons and mm belly and over ulnar nerve distribution Myofascial release to L wrist extensors mm belly Grade IV proximal radial mobilization Grade IV cervical lateral glides thorughout the cervical spine and upper cervical mobilization TherEx: L wrist flexion stretch: 5 x 30"     PATIENT EDUCATION:  Education details: see above Person educated: Patient Education method: Explanation Education comprehension: verbalized understanding  HOME EXERCISE PROGRAM: Access Code: [facility] URL:  [email or web address] Date: 07/11/2023 Prepared by: Lavone Nian  Exercises - Standing Ulnar Nerve Glide  - 3-5 x daily - 7 x weekly - 1 sets - 10 reps  07/16/23: added wrist flexion stretch to routine.  ASSESSMENT:  CLINICAL IMPRESSION: Pt is responding well with therapy as she is reporting that she is not waking up with headaches in the morning and in her numbness in her last 3 fingers in L UE is better with prolonged holding the phone and book. Pt had strained R wrist extensors with ulnar nerve mobilization stretch so today's session was focused on improving those symptoms as well. Pt demo 70 deg cervical roation post manual therapy at end of the session.  OBJECTIVE IMPAIRMENTS: decreased ROM, hypomobility, increased fascial restrictions, impaired flexibility, impaired sensation, and pain.   ACTIVITY LIMITATIONS: carrying and driving, sleeping  PARTICIPATION LIMITATIONS: cleaning, driving, and sleeping  PERSONAL FACTORS: Time since onset of injury/illness/exacerbation are also affecting patient's functional outcome.   REHAB POTENTIAL: Excellent  CLINICAL DECISION MAKING: Stable/uncomplicated  EVALUATION COMPLEXITY: Low   GOALS: Goals reviewed with patient? Yes  SHORT TERM GOALS: Target date: 08/08/2023    Pt will be I and compliant with HEP to self manage her symptoms Baseline: Initiated HEP today. Goal status: INITIAL  2.  Pt will report 50% improvement in her neck pain and radiculopathy symptoms to improve overall function.  Baseline: neck pain in morning 7-8/10, radiculopathy in L hand after 10 min of holding the phone Goal status: INITIAL      LONG TERM GOALS: Target date: 09/05/2023    Pt will demo 70 deg of cervical rotation to left to improve driving without pain Baseline: 55 deg L rotation (07/11/23) Goal status: INITIAL  2.  Pt will be able to read a book or hold the phone for up to 45 min before she experiences any radiculopathy in her  left hand.  Baseline: ~10 min before it starts (07/11/23) Goal status: INITIAL    PLAN:  PT FREQUENCY: 1x/week  PT DURATION: 8 weeks  PLANNED INTERVENTIONS: Therapeutic exercises, Therapeutic activity, Neuromuscular re-education, Balance training, Gait training, Patient/Family education, Self Care, Joint mobilization, Dry Needling, Electrical stimulation, Spinal mobilization, Cryotherapy, Moist heat, Manual therapy, and Re-evaluation  PLAN FOR NEXT SESSION: Review ulnar nerve self mobilization, address left cervical hypomobility with rotation   Ileana Ladd, PT 07/16/2023, 11:10 AM

## 2023-07-23 ENCOUNTER — Ambulatory Visit: Payer: Medicare Other

## 2023-07-23 DIAGNOSIS — M5412 Radiculopathy, cervical region: Secondary | ICD-10-CM

## 2023-07-23 DIAGNOSIS — M542 Cervicalgia: Secondary | ICD-10-CM

## 2023-07-23 NOTE — Therapy (Signed)
OUTPATIENT PHYSICAL THERAPY CERVICAL TREATMENT NOTE   Patient Name: Gwendolyn Perez MRN: [id number] DOB:01/20/1946, 77 y.o., female Today's Date: 07/23/2023  END OF SESSION:  PT End of Session - 07/23/23 1106     Visit Number 3    Number of Visits 9    Date for PT Re-Evaluation 09/05/23    PT Start Time 1100    PT Stop Time 1130    PT Time Calculation (min) 30 min    Activity Tolerance Patient tolerated treatment well    Behavior During Therapy WFL for tasks assessed/performed              Past Medical History:  Diagnosis Date   Arthritis 10 yrs ago   Thumbs,coxic, neck, hip, knees   GERD (gastroesophageal reflux disease) March 2020   Hiatal hernia   Healthcare maintenance 11/22/2022   Heart murmur Birth   Functional heart murmur   Hypercholesterolemia 02/20/2022   Obstructive sleep apnea (adult) (pediatric) 02/20/2022   Thyroid disease Hypothyroidism   Mildly affected   Past Surgical History:  Procedure Laterality Date   ABDOMINAL HYSTERECTOMY  2000   By laparoscopic surgery   Patient Active Problem List   Diagnosis Date Noted   Left cervical radiculopathy 06/26/2023   Elevated blood pressure reading 06/19/2023   Paresthesia 12/26/2022   Cervicalgia 12/26/2022   Left lumbar pain 12/26/2022   Healthcare maintenance 11/22/2022   Goals of care, counseling/discussion 11/22/2022   Hiatal hernia with gastroesophageal reflux 11/14/2022   Internal hemorrhoid 11/14/2022   Leg length discrepancy 11/14/2022   Hearing impairment 11/14/2022   Cerumen impaction 11/14/2022   Multiple falls in March 2023 02/28/2022   Hashimoto's thyroiditis 02/20/2022   Multiple thyroid nodules 02/20/2022   Osteopenia 02/20/2022   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   Hypercholesterolemia 02/20/2022   Benign paroxysmal positional vertigo 02/20/2022   Osteoarthritis, localized, hand, unspecified laterality 08/22/2020   Hip pain 08/22/2020    PCP:    REFERRING PROVIDER: Levert Feinstein,  MD  REFERRING DIAG: M54.12 (ICD-10-CM) - Left cervical radiculopathy  THERAPY DIAG:  Neck pain  Radiculopathy, cervical region  Rationale for Evaluation and Treatment: Rehabilitation  ONSET DATE: 06/26/2023  SUBJECTIVE:                                                                                                                                                                                                         SUBJECTIVE STATEMENT: Pt reports no significant neck pain. Pt has not reported significant numbness in her hand but also hasn't held a book/phone for prolonged time. Pt is  reporting no muscle pain in wrist extensors. Hand dominance: Right  PERTINENT HISTORY:  OA, GERD  PAIN:  Are you having pain? Yes: NPRS scale: 7-8/10 Pain location: Left side of the neck Pain description: sharp, achy Aggravating factors: sleeping, turning head, holding the phone Relieving factors: rest, gabapentin  PRECAUTIONS: None  RED FLAGS: None     WEIGHT BEARING RESTRICTIONS: No  FALLS:  Has patient fallen in last 6 months? 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 At C5-C6, there are degenerative changes causing moderate left foraminal narrowing encroaching upon the left C6 nerve root though there does not appear to be nerve root compression. Milder degenerative changes at C3-C4, C6-C7 and C7-T1 do not lead to spinal stenosis or nerve root  compression.  PATIENT SURVEYS:  NDI 18%  COGNITION: Overall cognitive status: Within functional limits for tasks assessed   CERVICAL ROM:   Active ROM A/PROM (deg) eval AROM 07/16/23 AROM 07/23/23  Flexion 70    Extension 60    Right lateral flexion 25    Left lateral flexion 25    Right rotation 70 60 70  Left rotation 55 65 68    (Blank rows = not tested)  UPPER EXTREMITY ROM:  Active ROM Right eval Left eval  Shoulder flexion    Shoulder extension    Shoulder abduction    Shoulder adduction    Shoulder extension    Shoulder internal rotation    Shoulder external rotation    Elbow flexion    Elbow extension    Wrist flexion    Wrist extension    Wrist ulnar deviation    Wrist radial deviation    Wrist pronation    Wrist supination     (Blank rows = not tested)  UPPER EXTREMITY MMT:  MMT Right eval Left eval  Shoulder flexion    Shoulder extension    Shoulder abduction    Shoulder adduction    Shoulder extension    Shoulder internal rotation    Shoulder external rotation    Middle trapezius    Lower trapezius    Elbow flexion    Elbow extension    Wrist flexion    Wrist extension    Wrist ulnar deviation    Wrist radial deviation    Wrist pronation    Wrist supination    Grip strength 53 lbs 50 lbs   (Blank rows = not tested)   TODAY'S TREATMENT:  DATE:  Cervical ROM screening: R rotation- pt reporting pain in left c-spine mid and L rotation (moderately limited compared to R rotation) pain in R upper c spine. Manual therapy: Movement with self mobilization using the strap: cervical rotation with end range pull with strap: emphasis on mid c-spine with R rotation and upper c-spine with L rotation with placement of strap: 15x R rotation, 10x L rotation- after this exercise pt reported decreased pain with bil  rotation  Seated trunk twist in chair with arm rest: 5 x 10" R and L Seated trunk twist at edge of bed without arm rest: 3-5x each side  WB wrist extension stretch on mat table: 10x 5" holds      PATIENT EDUCATION:  Education details: see above Person educated: Patient Education method: Explanation Education comprehension: verbalized understanding  HOME EXERCISE PROGRAM: Access Code: [facility] URL: [email or web address] Date: 07/23/2023 Prepared by: Lavone Nian  Exercises - Standing Ulnar Nerve Glide  - 3-5 x daily - 7 x weekly - 1 sets - 10 reps - Seated Trunk Rotation  - 1 x daily - 7 x weekly - 5-10 reps  07/16/23: added wrist flexion stretch to routine.  ASSESSMENT:  CLINICAL IMPRESSION: Pt's cervical AROM is almost within normal range bil with mild stiffness with end range L cervical rotation. Pt is demonstrating compliance with HEP. Both STGs met today.  OBJECTIVE IMPAIRMENTS: decreased ROM, hypomobility, increased fascial restrictions, impaired flexibility, impaired sensation, and pain.   ACTIVITY LIMITATIONS: carrying and driving, sleeping  PARTICIPATION LIMITATIONS: cleaning, driving, and sleeping  PERSONAL FACTORS: Time since onset of injury/illness/exacerbation are also affecting patient's functional outcome.   REHAB POTENTIAL: Excellent  CLINICAL DECISION MAKING: Stable/uncomplicated  EVALUATION COMPLEXITY: Low   GOALS: Goals reviewed with patient? Yes  SHORT TERM GOALS: Target date: 08/08/2023    Pt will be I and compliant with HEP to self manage her symptoms Baseline: Initiated HEP today., 100% compliant 07/23/23 Goal status: Goal met  2.  Pt will report 50% improvement in her neck pain and radiculopathy symptoms to improve overall function.  Baseline: neck pain in morning 7-8/10, radiculopathy in L hand after 10 min of holding the phone; pt reporting 0/10 pain in neck 07/23/23 Goal status: goal met      LONG TERM GOALS:  Target date: 09/05/2023    Pt will demo 70 deg of cervical rotation to left to improve driving without pain Baseline: 55 deg L rotation (07/11/23) Goal status: INITIAL  2.  Pt will be able to read a book or hold the phone for up to 45 min before she experiences any radiculopathy in her left hand.  Baseline: ~10 min before it starts (07/11/23) Goal status: INITIAL    PLAN:  PT FREQUENCY: 1x/week  PT DURATION: 8 weeks  PLANNED INTERVENTIONS: Therapeutic exercises, Therapeutic activity, Neuromuscular re-education, Balance training, Gait training, Patient/Family education, Self Care, Joint mobilization, Dry Needling, Electrical stimulation, Spinal mobilization, Cryotherapy, Moist heat, Manual therapy, and Re-evaluation  PLAN FOR NEXT SESSION: possible D/C next session   Ileana Ladd, PT 07/23/2023, 11:38 AM

## 2023-07-30 ENCOUNTER — Ambulatory Visit: Payer: Medicare Other

## 2023-07-30 ENCOUNTER — Other Ambulatory Visit: Payer: Self-pay

## 2023-07-30 DIAGNOSIS — M542 Cervicalgia: Secondary | ICD-10-CM | POA: Diagnosis not present

## 2023-07-30 DIAGNOSIS — M5412 Radiculopathy, cervical region: Secondary | ICD-10-CM | POA: Diagnosis not present

## 2023-07-30 NOTE — Therapy (Signed)
OUTPATIENT PHYSICAL THERAPY CERVICAL TREATMENT NOTE   Patient Name: Gwendolyn Perez MRN: [id number] DOB:09-Jul-1946, 77 y.o., female Today's Date: 07/30/2023  END OF SESSION:  PT End of Session - 07/30/23 1101     Visit Number 4    Number of Visits 9    Date for PT Re-Evaluation 09/05/23    PT Start Time 1100    PT Stop Time 1145    PT Time Calculation (min) 45 min    Activity Tolerance Patient tolerated treatment well    Behavior During Therapy WFL for tasks assessed/performed              Past Medical History:  Diagnosis Date   Arthritis 10 yrs ago   Thumbs,coxic, neck, hip, knees   GERD (gastroesophageal reflux disease) March 2020   Hiatal hernia   Healthcare maintenance 11/22/2022   Heart murmur Birth   Functional heart murmur   Hypercholesterolemia 02/20/2022   Obstructive sleep apnea (adult) (pediatric) 02/20/2022   Thyroid disease Hypothyroidism   Mildly affected   Past Surgical History:  Procedure Laterality Date   ABDOMINAL HYSTERECTOMY  2000   By laparoscopic surgery   Patient Active Problem List   Diagnosis Date Noted   Left cervical radiculopathy 06/26/2023   Elevated blood pressure reading 06/19/2023   Paresthesia 12/26/2022   Cervicalgia 12/26/2022   Left lumbar pain 12/26/2022   Healthcare maintenance 11/22/2022   Goals of care, counseling/discussion 11/22/2022   Hiatal hernia with gastroesophageal reflux 11/14/2022   Internal hemorrhoid 11/14/2022   Leg length discrepancy 11/14/2022   Hearing impairment 11/14/2022   Cerumen impaction 11/14/2022   Multiple falls in March 2023 02/28/2022   Hashimoto's thyroiditis 02/20/2022   Multiple thyroid nodules 02/20/2022   Osteopenia 02/20/2022   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   Hypercholesterolemia 02/20/2022   Benign paroxysmal positional vertigo 02/20/2022   Osteoarthritis, localized, hand, unspecified laterality 08/22/2020   Hip pain 08/22/2020    PCP:    REFERRING PROVIDER: Levert Feinstein,  MD  REFERRING DIAG: M54.12 (ICD-10-CM) - Left cervical radiculopathy  THERAPY DIAG:  Neck pain  Radiculopathy, cervical region  Rationale for Evaluation and Treatment: Rehabilitation  ONSET DATE: 06/26/2023  SUBJECTIVE:                                                                                                                                                                                                         SUBJECTIVE STATEMENT: Pt reports not waking up with headache anymore. Still feeling stiffness turning head to left. Hand dominance: Right  PERTINENT HISTORY:  OA, GERD  PAIN:  Are you having pain? Yes: NPRS scale: 7-8/10 Pain location: Left side of the neck Pain description: sharp, achy Aggravating factors: sleeping, turning head, holding the phone Relieving factors: rest, gabapentin  PRECAUTIONS: None  RED FLAGS: None     WEIGHT BEARING RESTRICTIONS: No  FALLS:  Has patient fallen in last 6 months? 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 At C5-C6, there are degenerative changes causing moderate left foraminal narrowing encroaching upon the left C6 nerve root though there does not appear to be nerve root compression. Milder degenerative changes at C3-C4, C6-C7 and C7-T1 do not lead to spinal stenosis or nerve root compression.  PATIENT SURVEYS:  NDI 18%  COGNITION: Overall cognitive status: Within functional  limits for tasks assessed   CERVICAL ROM:   Active ROM A/PROM (deg) eval AROM 07/16/23 AROM 07/23/23  Flexion 70    Extension 60    Right lateral flexion 25    Left lateral flexion 25    Right rotation 70 60 70  Left rotation 55 65 68    (Blank rows = not tested)  UPPER EXTREMITY ROM:  Active ROM Right eval Left eval  Shoulder flexion    Shoulder extension    Shoulder abduction    Shoulder adduction    Shoulder extension    Shoulder internal rotation    Shoulder external rotation    Elbow flexion    Elbow extension    Wrist flexion    Wrist extension    Wrist ulnar deviation    Wrist radial deviation    Wrist pronation    Wrist supination     (Blank rows = not tested)  UPPER EXTREMITY MMT:  MMT Right eval Left eval  Shoulder flexion    Shoulder extension    Shoulder abduction    Shoulder adduction    Shoulder extension    Shoulder internal rotation    Shoulder external rotation    Middle trapezius    Lower trapezius    Elbow flexion    Elbow extension    Wrist flexion    Wrist extension    Wrist ulnar deviation    Wrist radial deviation    Wrist pronation    Wrist supination    Grip strength 53 lbs 50 lbs   (Blank rows = not tested)   TODAY'S TREATMENT:  DATE:  R grade IV 1st rib PA mobilization- improved stiffness and end range pain and ROM Grade IV R facet joint mobilization in mid c-spine: pt's ROM got worst afterwards Performed some for 1st rib PA mobilization- pain and stiffness improved again back to baseline  Movement with self mobilization using the strap: cervical rotation with end range pull with strap: emphasis on m upper c-spine with L rotation with placement of strap: 3 x 10- ROM stiffened up today  Movement with mobilization: sefl inferior mobilization of 1st rib on R with bed sheet with cervical rotation to  left: 2 x 10 added to HEP      PATIENT EDUCATION:  Education details: see above Person educated: Patient Education method: Explanation Education comprehension: verbalized understanding  HOME EXERCISE PROGRAM: Access Code: [facility] URL: [email or web address] Date: 07/23/2023 Prepared by: Lavone Nian  Exercises - Standing Ulnar Nerve Glide  - 3-5 x daily - 7 x weekly - 1 sets - 10 reps - Seated Trunk Rotation  - 1 x daily - 7 x weekly - 5-10 reps  07/16/23: added wrist flexion stretch to routine.  ASSESSMENT:  CLINICAL IMPRESSION: Pt is reporting improving symptoms overall. Still has mil limitation with left cervical rotation which is gradually resolving.   OBJECTIVE IMPAIRMENTS: decreased ROM, hypomobility, increased fascial restrictions, impaired flexibility, impaired sensation, and pain.   ACTIVITY LIMITATIONS: carrying and driving, sleeping  PARTICIPATION LIMITATIONS: cleaning, driving, and sleeping  PERSONAL FACTORS: Time since onset of injury/illness/exacerbation are also affecting patient's functional outcome.   REHAB POTENTIAL: Excellent  CLINICAL DECISION MAKING: Stable/uncomplicated  EVALUATION COMPLEXITY: Low   GOALS: Goals reviewed with patient? Yes  SHORT TERM GOALS: Target date: 08/08/2023    Pt will be I and compliant with HEP to self manage her symptoms Baseline: Initiated HEP today., 100% compliant 07/23/23 Goal status: Goal met  2.  Pt will report 50% improvement in her neck pain and radiculopathy symptoms to improve overall function.  Baseline: neck pain in morning 7-8/10, radiculopathy in L hand after 10 min of holding the phone; pt reporting 0/10 pain in neck 07/23/23 Goal status: goal met      LONG TERM GOALS: Target date: 09/05/2023    Pt will demo 70 deg of cervical rotation to left to improve driving without pain Baseline: 55 deg L rotation (07/11/23) Goal status: INITIAL  2.  Pt will be able to read a book or hold  the phone for up to 45 min before she experiences any radiculopathy in her left hand.  Baseline: ~10 min before it starts (07/11/23) Goal status: INITIAL    PLAN:  PT FREQUENCY: 1x/week  PT DURATION: 8 weeks  PLANNED INTERVENTIONS: Therapeutic exercises, Therapeutic activity, Neuromuscular re-education, Balance training, Gait training, Patient/Family education, Self Care, Joint mobilization, Dry Needling, Electrical stimulation, Spinal mobilization, Cryotherapy, Moist heat, Manual therapy, and Re-evaluation  PLAN FOR NEXT SESSION: possible D/C next session   Ileana Ladd, PT 07/30/2023, 11:04 AM

## 2023-08-01 MED ORDER — DULOXETINE HCL 60 MG PO CPEP: 60.0000 mg | ORAL_CAPSULE | Freq: Every day | ORAL | 2 refills | Status: DC

## 2023-08-06 ENCOUNTER — Ambulatory Visit: Payer: Medicare Other

## 2023-08-06 DIAGNOSIS — M5412 Radiculopathy, cervical region: Secondary | ICD-10-CM | POA: Diagnosis not present

## 2023-08-06 DIAGNOSIS — M542 Cervicalgia: Secondary | ICD-10-CM | POA: Diagnosis not present

## 2023-08-06 NOTE — Therapy (Signed)
OUTPATIENT PHYSICAL THERAPY CERVICAL DISCHARGE NOTE   Patient Name: Gwendolyn Perez MRN: [id number] DOB:December 01, 1946, 77 y.o., female Today's Date: 08/06/2023  PHYSICAL THERAPY DISCHARGE SUMMARY  Visits from Start of Care: 5  Current functional level related to goals / functional outcomes: LTG#2   Remaining deficits: Continued paraesthesia with prolonged phone use or book reading   Education / Equipment: HEP   Patient agrees to discharge. Patient goals were partially met. Patient is being discharged due to maximized rehab potential.    END OF SESSION:  PT End of Session - 08/06/23 1057     Visit Number 5    Number of Visits 9    Date for PT Re-Evaluation 09/05/23    PT Start Time 1100    PT Stop Time 1145    PT Time Calculation (min) 45 min    Activity Tolerance Patient tolerated treatment well    Behavior During Therapy WFL for tasks assessed/performed              Past Medical History:  Diagnosis Date   Arthritis 10 yrs ago   Thumbs,coxic, neck, hip, knees   GERD (gastroesophageal reflux disease) March 2020   Hiatal hernia   Healthcare maintenance 11/22/2022   Heart murmur Birth   Functional heart murmur   Hypercholesterolemia 02/20/2022   Obstructive sleep apnea (adult) (pediatric) 02/20/2022   Thyroid disease Hypothyroidism   Mildly affected   Past Surgical History:  Procedure Laterality Date   ABDOMINAL HYSTERECTOMY  2000   By laparoscopic surgery   Patient Active Problem List   Diagnosis Date Noted   Left cervical radiculopathy 06/26/2023   Elevated blood pressure reading 06/19/2023   Paresthesia 12/26/2022   Cervicalgia 12/26/2022   Left lumbar pain 12/26/2022   Healthcare maintenance 11/22/2022   Goals of care, counseling/discussion 11/22/2022   Hiatal hernia with gastroesophageal reflux 11/14/2022   Internal hemorrhoid 11/14/2022   Leg length discrepancy 11/14/2022   Hearing impairment 11/14/2022   Cerumen impaction 11/14/2022   Multiple  falls in March 2023 02/28/2022   Hashimoto's thyroiditis 02/20/2022   Multiple thyroid nodules 02/20/2022   Osteopenia 02/20/2022   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   Hypercholesterolemia 02/20/2022   Benign paroxysmal positional vertigo 02/20/2022   Osteoarthritis, localized, hand, unspecified laterality 08/22/2020   Hip pain 08/22/2020    PCP:    REFERRING PROVIDER: Levert Feinstein, MD  REFERRING DIAG: M54.12 (ICD-10-CM) - Left cervical radiculopathy  THERAPY DIAG:  Neck pain  Radiculopathy, cervical region  Rationale for Evaluation and Treatment: Rehabilitation  ONSET DATE: 06/26/2023  SUBJECTIVE:  SUBJECTIVE STATEMENT: Pt reports that she was holding her phone and noted tingling in L hand after about 8 min Hand dominance: Right  PERTINENT HISTORY:  OA, GERD  PAIN:  Are you having pain? Yes: NPRS scale: 7-8/10 Pain location: Left side of the neck Pain description: sharp, achy Aggravating factors: sleeping, turning head, holding the phone Relieving factors: rest, gabapentin  PRECAUTIONS: None  RED FLAGS: None     WEIGHT BEARING RESTRICTIONS: No  FALLS:  Has patient fallen in last 6 months? 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 At C5-C6, there are degenerative changes causing moderate left foraminal narrowing encroaching upon the left C6 nerve root though there does not appear to be nerve root compression. Milder degenerative changes at C3-C4, C6-C7 and C7-T1 do not lead to spinal stenosis or nerve root compression.  PATIENT SURVEYS:  NDI 18%  COGNITION: Overall cognitive status: Within functional limits for tasks assessed   CERVICAL ROM:   Active ROM A/PROM (deg) eval AROM 07/16/23 AROM 07/23/23 AROM 08/06/23  Flexion 70     Extension 60     Right lateral flexion 25     Left lateral flexion 25     Right rotation 70 60 70 67  Left rotation 55 65 68  67   (Blank rows = not tested)  UPPER EXTREMITY ROM:  Active ROM Right eval Left eval  Shoulder flexion    Shoulder extension    Shoulder abduction    Shoulder adduction    Shoulder extension    Shoulder internal rotation    Shoulder external rotation    Elbow flexion    Elbow extension    Wrist flexion    Wrist extension    Wrist ulnar deviation    Wrist radial deviation    Wrist pronation    Wrist supination     (Blank rows = not tested)  UPPER EXTREMITY MMT:  MMT Right eval Left eval  Shoulder flexion    Shoulder extension    Shoulder abduction    Shoulder adduction    Shoulder extension    Shoulder internal rotation    Shoulder external rotation    Middle trapezius    Lower trapezius    Elbow flexion    Elbow extension    Wrist flexion    Wrist extension    Wrist ulnar deviation    Wrist radial deviation    Wrist pronation    Wrist supination    Grip strength 53 lbs 50 lbs   (Blank rows = not tested)   TODAY'S TREATMENT:  DATE:  R grade IV 1st rib PA mobilization- improved stiffness and end range pain and ROM Grade IV R facet joint mobilization in mid c-spine: pt's ROM got worst  afterwards Performed some for 1st rib PA mobilization- pain and stiffness improved again back to baseline  R sidelying shoulder elevation and depression passively for soft tissue stretching and 1st rib mobilization  Pt educated on how to mobilize 1st rib with had movements and her husband pushing down on 1st rib.     PATIENT EDUCATION:  Education details: see above Person educated: Patient Education method: Explanation Education comprehension: verbalized understanding  HOME EXERCISE PROGRAM: Access Code: [facility] URL: [email or web address] Date: 07/23/2023 Prepared by: Lavone Nian  Exercises - Standing Ulnar Nerve Glide  - 3-5 x daily - 7 x weekly - 1 sets - 10 reps - Seated Trunk Rotation  - 1 x daily - 7 x weekly - 5-10 reps  07/16/23: added wrist flexion stretch to routine.  ASSESSMENT:  CLINICAL IMPRESSION: Pt has been seen for total of 5 sessions from 07/11/23 to 08/06/23. Patient has met all of her short term goals and 1/2 long term goals. Patient will continue to work on remainder of her symptoms of L UE tingling with prolonged UE positioning, at home with neural mobilization. Patient will be discharged from skilled PT with independent HEP.  OBJECTIVE IMPAIRMENTS: decreased ROM, hypomobility, increased fascial restrictions, impaired flexibility, impaired sensation, and pain.   ACTIVITY LIMITATIONS: carrying and driving, sleeping  PARTICIPATION LIMITATIONS: cleaning, driving, and sleeping  PERSONAL FACTORS: Time since onset of injury/illness/exacerbation are also affecting patient's functional outcome.   REHAB POTENTIAL: Excellent  CLINICAL DECISION MAKING: Stable/uncomplicated  EVALUATION COMPLEXITY: Low   GOALS: Goals reviewed with patient? Yes  SHORT TERM GOALS: Target date: 08/08/2023    Pt will be I and compliant with HEP to self manage her symptoms Baseline: Initiated HEP today., 100% compliant 07/23/23 Goal status: Goal met  2.  Pt will  report 50% improvement in her neck pain and radiculopathy symptoms to improve overall function.  Baseline: neck pain in morning 7-8/10, radiculopathy in L hand after 10 min of holding the phone; pt reporting 0/10 pain in neck 07/23/23 Goal status: goal met      LONG TERM GOALS: Target date: 09/05/2023    Pt will demo 70 deg of cervical rotation to left to improve driving without pain Baseline: 55 deg L rotation (07/11/23) 67 deg bil rotation (08/06/23) Goal status: goal met  2.  Pt will be able to read a book or hold the phone for up to 45 min before she experiences any radiculopathy in her left hand.  Baseline: ~10 min before it starts (07/11/23); ~8-10 min (08/06/23) Goal status: not met    PLAN:  PT FREQUENCY: 1x/week  PT DURATION: 8 weeks  PLANNED INTERVENTIONS: Therapeutic exercises, Therapeutic activity, Neuromuscular re-education, Balance training, Gait training, Patient/Family education, Self Care, Joint mobilization, Dry Needling, Electrical stimulation, Spinal mobilization, Cryotherapy, Moist heat, Manual therapy, and Re-evaluation  PLAN FOR NEXT SESSION: possible D/C next session   Ileana Ladd, PT 08/06/2023, 10:57 AM

## 2023-08-08 ENCOUNTER — Encounter: Payer: Self-pay | Admitting: Internal Medicine

## 2023-08-08 DIAGNOSIS — B002 Herpesviral gingivostomatitis and pharyngotonsillitis: Secondary | ICD-10-CM

## 2023-08-08 MED ORDER — VALACYCLOVIR HCL 1 G PO TABS: 500.0000 mg | ORAL_TABLET | Freq: Every day | ORAL | 5 refills | Status: DC | PRN

## 2023-08-25 ENCOUNTER — Encounter: Payer: Self-pay | Admitting: Internal Medicine

## 2023-08-25 DIAGNOSIS — H903 Sensorineural hearing loss, bilateral: Secondary | ICD-10-CM | POA: Diagnosis not present

## 2023-08-25 DIAGNOSIS — H6122 Impacted cerumen, left ear: Secondary | ICD-10-CM | POA: Diagnosis not present

## 2023-08-25 DIAGNOSIS — R0683 Snoring: Secondary | ICD-10-CM | POA: Diagnosis not present

## 2023-08-26 ENCOUNTER — Other Ambulatory Visit: Payer: Self-pay | Admitting: Internal Medicine

## 2023-08-26 DIAGNOSIS — B002 Herpesviral gingivostomatitis and pharyngotonsillitis: Secondary | ICD-10-CM

## 2023-08-26 MED ORDER — VALACYCLOVIR HCL 1 G PO TABS: 500.0000 mg | ORAL_TABLET | Freq: Every day | ORAL | 5 refills | Status: DC | PRN

## 2023-09-16 ENCOUNTER — Encounter: Payer: Self-pay | Admitting: Internal Medicine

## 2023-09-16 ENCOUNTER — Ambulatory Visit: Payer: Medicare Other | Admitting: Internal Medicine

## 2023-09-16 VITALS — BP 124/79 | HR 79 | Temp 98.2°F | Ht 64.0 in | Wt 155.7 lb

## 2023-09-16 DIAGNOSIS — E063 Autoimmune thyroiditis: Secondary | ICD-10-CM

## 2023-09-16 DIAGNOSIS — E669 Obesity, unspecified: Secondary | ICD-10-CM | POA: Diagnosis not present

## 2023-09-16 DIAGNOSIS — E65 Localized adiposity: Secondary | ICD-10-CM

## 2023-09-16 DIAGNOSIS — T43205D Adverse effect of unspecified antidepressants, subsequent encounter: Secondary | ICD-10-CM | POA: Diagnosis not present

## 2023-09-16 DIAGNOSIS — M542 Cervicalgia: Secondary | ICD-10-CM

## 2023-09-16 DIAGNOSIS — T43205A Adverse effect of unspecified antidepressants, initial encounter: Secondary | ICD-10-CM

## 2023-09-16 DIAGNOSIS — B002 Herpesviral gingivostomatitis and pharyngotonsillitis: Secondary | ICD-10-CM

## 2023-09-16 DIAGNOSIS — R52 Pain, unspecified: Secondary | ICD-10-CM

## 2023-09-16 HISTORY — DX: Adverse effect of unspecified antidepressants, initial encounter: T43.205A

## 2023-09-16 MED ORDER — DULOXETINE HCL 30 MG PO CPEP: ORAL_CAPSULE | ORAL | 3 refills | Status: DC

## 2023-09-16 NOTE — Progress Notes (Signed)
Routine f/u visit with particular interest in her recurrent mouth sores, for which she has been taking valcylovir.  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  The frequency of outbreaks has increased since early 2024 - she questions whether starting any new meds may be contributing.  She has otherwise noticed a correlation with outbreaks and increased dietary sugar.  Lesions begin as painful small red bumps and are usually on her tongue - either surface or side.  Never on lips or buccal mucosa.    Interested in losing weight and engaging guidance and supervision for suggestions and accountability.  Is walking regularly.  Opportunity for adjustments in diet.  We discussed GLP1a medications (insurance would not cover for non-diabetic) and other medications.  We agreed on referral to weight management clinic with desire for a sustainable and healthy approach to weight loss.  I suggested a pound a month!  In one year she would be down 12# (her BMI is normal; she is concerned about weight around her middle).  Nerve conductivity test tomorrow with Dr. Terrace Arabia.    BP 124/79 (BP Location: Right Arm, Patient Position: Sitting, Cuff Size: Small)   Pulse 79   Temp 98.2 F (36.8 C) (Oral)   Ht 5\' 4"  (1.626 m)   Wt 155 lb 11.2 oz (70.6 kg)   SpO2 100%   BMI 26.73 kg/m  Youthful, vigorous, well appearance.  Mouth exam: normal tongue appearance.  No abnormal lesions on any surface (mucosa, gingiva, palpate, pharynx, lips).    Problems addressed (not in priority):  Cervicalgia Therapist recommended new pillow and exercises, has essentially controlled the problem.   Hashimoto's thyroiditis I will be managing Synthroid/hypothyroidism going forward.    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  The frequency of outbreaks has  increased since early 2024 - she questions whether starting any new meds may be contributing.  She has otherwise noticed a correlation with outbreaks and increased dietary sugar.  Lesions begin as single painful small red bumps (not blisters) and are usually on her tongue - either surface or side.  Never on lips or buccal mucosa.  No lesions in mouth today.  We looked up photos of various oral ulcers and she identifies the herpes lesions as the ones she has.  She has not had anything like aphthous ulcers or cold sores.    Serotonin withdrawal syndrome Recent episode of emotional irritability, mood change, and strange head sensations upon not taking duloxetine for several days (camping; had forgotten to bring it).  Symptoms resolved upon resuming.  Noted for historical reference.    Central adiposity Desires guided sustainable weight loss plan (note bmi not elevated but she does have central weight).  Referred to weight management clinic.

## 2023-09-16 NOTE — Assessment & Plan Note (Signed)
I will be managing Synthroid/hypothyroidism going forward.

## 2023-09-16 NOTE — Assessment & Plan Note (Signed)
Therapist recommended new pillow and exercises, has essentially controlled the problem.

## 2023-09-17 ENCOUNTER — Ambulatory Visit: Payer: Self-pay | Admitting: Neurology

## 2023-09-17 ENCOUNTER — Ambulatory Visit (INDEPENDENT_AMBULATORY_CARE_PROVIDER_SITE_OTHER): Payer: Medicare Other | Admitting: Neurology

## 2023-09-17 VITALS — BP 107/69 | HR 80 | Ht 64.0 in | Wt 149.0 lb

## 2023-09-17 DIAGNOSIS — Z0289 Encounter for other administrative examinations: Secondary | ICD-10-CM

## 2023-09-17 DIAGNOSIS — M5412 Radiculopathy, cervical region: Secondary | ICD-10-CM | POA: Diagnosis not present

## 2023-09-17 NOTE — Progress Notes (Addendum)
No chief complaint on file.     ASSESSMENT AND PLAN  Gwendolyn Perez is a 77 y.o. female  Neck pain, radiating pain to left upper extremity,  Most worrisome for left cervical radiculopathy  EMG nerve conduction study  MRI of cervical spine  Gabapentin 300 mg up to 3 tablets daily  She has been bothered by her left neck pain for while, desire, epidural injection referral to [facility]   Bilateral lower extremity paresthesia, urged to move,  Much improved with Cymbalta 60 mg daily    DIAGNOSTIC DATA (LABS, IMAGING, TESTING) - I reviewed patient records, labs, notes, testing and imaging myself where available.   MEDICAL HISTORY:  Gwendolyn Perez is a 76 year old female, accompanied by her husband, and daughter Selena Batten, seen in request by her primary care doctor Dr. Modesto Charon, Thelma Barge for episode of falling, initial evaluation was on March 12, 2022  I reviewed and summarized the referring note. PMHX. 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, when she just came back from a call, she received another call, in a standing up position tried to put on her pants, she fell backwards,  She complains of lower back pain from impact, but otherwise denies sensory loss of bilateral upper or lower extremity, denies weakness, denies bowel and bladder incontinence  She denies warning signs prior to falling, no chest pain, no heart palpitation, denied loss of consciousness,  There is no orthostatic blood  pressure change on today's examination,  UPDATE Dec 26 2022: She is accompanied by her husband at today's clinical visit, has no recurrent falling episode, continue to work as a Orthoptist at [employer], denied difficulty walking, but over the past 6 months, she has frequent neck pain, wake up in the morning often feel neck muscle tension, if she lie on the left side, noticed radiating pain, paresthesia to left upper extremity and left hand, happen to the right side if she lie on the right side  Husband also reported that she has a lot of loud snoring, she complains of diffuse body achy pain, extreme fatigue during the day, she denies bowel or bladder incontinence  Also has left lower back pain, radiating pain to left lower extremity,  She has many years history of body tenderness upon deep palpitation, previously carried a diagnosis of restless leg syndrome, urged to move her leg, continue to have bilateral feet numbing, tingling itching sensation, urged to move her leg while lying still  UPDATE June 26 2023: She is accompanied by her husband at today's clinical visit, today her main concern is slow worsening left-sided neck pain, radiating pain to left shoulder, left hand numbness, getting numb by using her left arm for just few minutes,  She denies gait abnormality, denies bowel and bladder incontinence   Had sleep study May 2024, no evidence of obstructive sleep apnea, fragmentation abnormal sleep stage percentage,  Personally reviewed MRI of cervical spine, multilevel degenerative changes, most noticeable at  C5-6, moderate left foraminal narrowing, no significant canal stenosis  MRI of lumbar spine, mild degenerative changes, no significant canal foraminal narrowing, most noticeable L4-5, with moderate left lateral recess stenosis  Extensive laboratory evaluation January 2024 normal CPK ferritin B12 RPR protein electrophoresis A1c CMP CBC ESR ANA TSH  UPDATE Sep 17 2023:   PHYSICAL  EXAM:      09/17/2023    9:31 AM 09/16/2023   10:09 AM 06/26/2023    9:57 AM  Vitals with BMI  Height 5\' 4"  5\' 4"  5\' 4"   Weight 149 lbs 155 lbs 11 oz 149 lbs  BMI 25.56 26.71 25.56  Systolic 107 124 621  Diastolic 69 79 78  Pulse 80 79 67     PHYSICAL EXAMNIATION:  Gen: NAD, conversant, well nourised, well groomed                     Cardiovascular: Regular rate rhythm, no peripheral edema, warm, nontender. Eyes: Conjunctivae clear without exudates or hemorrhage Neck: Supple, no carotid bruits. Pulmonary: Clear to auscultation bilaterally   NEUROLOGICAL EXAM:  MENTAL STATUS: Speech/cognition: Awake, alert, oriented to history taking and casual conversation   CRANIAL NERVES: CN II: Visual fields are full to confrontation. Pupils are round equal and briskly reactive to light. CN III, IV, VI: extraocular movement are normal. No ptosis. CN V: Facial sensation is intact to light touch CN VII: Face is symmetric with normal eye closure  CN VIII: Hearing is normal to causal conversation. CN IX, X: Phonation is normal. CN XI: Head turning and shoulder shrug are intact  MOTOR: There is no pronator drift of out-stretched arms. Muscle bulk and tone are normal. Muscle strength is normal.  REFLEXES: Reflexes are 2  and symmetric at the biceps, triceps, knees, and ankles. Plantar responses are flexor.  SENSORY: Intact to light touch, pinprick and vibratory sensation are intact in fingers and toes.  COORDINATION: There is no trunk or limb dysmetria noted.  GAIT/STANCE: Posture is normal. Gait is steady with normal steps, base, arm swing, and turning. Heel and toe walking are normal.  Slight difficulty with tandem walking is expected with her age.  REVIEW OF SYSTEMS:  Full 14 system review of systems performed and notable only for as above All other review of systems were negative.   ALLERGIES: Allergies  Allergen Reactions   Black Cohosh Rash   Sulfa Antibiotics Rash     HOME MEDICATIONS: Current Outpatient Medications  Medication Sig Dispense Refill   acetaminophen (TYLENOL) 500 MG tablet 2 tablets as needed     Cholecalciferol (VITAMIN D3) 1000 units CAPS Take 1 capsule by mouth daily.     DULoxetine (CYMBALTA) 30 MG capsule Take 1 every morning.  Take 1 every evening as needed for anxiety, pain. 90 capsule 3   estradiol (ESTRACE) 0.1 MG/GM vaginal cream Place 1 Applicatorful vaginally daily.     gabapentin (NEURONTIN) 300 MG capsule Take 1 capsule (300 mg total) by mouth 3 (three) times daily as needed. 90 capsule 11   levothyroxine (SYNTHROID) 25 MCG tablet      omeprazole (PRILOSEC) 20 MG capsule Take 1 capsule (20 mg total) by mouth daily. 90 capsule 3   PFIZER COVID-19 VAC BIVALENT injection      valACYclovir (VALTREX) 1000 MG tablet Take 0.5 tablets (500 mg total) by mouth daily as needed (herpes stomatitis). 30 tablet 5   No current facility-administered medications for this visit.    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       Obstructive sleep apnea (adult) (pediatric) 02/20/2022   Thyroid disease Hypothyroidism   Mildly affected    PAST SURGICAL HISTORY: Past Surgical History:  Procedure Laterality Date   ABDOMINAL HYSTERECTOMY  2000   By laparoscopic surgery    FAMILY HISTORY: Family History  Problem Relation Age of Onset   Healthy Mother    Cancer Father    Hearing loss Paternal Grandfather    Arthritis Paternal Grandmother    Arthritis Paternal Aunt    Hearing loss Paternal Aunt     SOCIAL HISTORY: Social History   Socioeconomic History    Marital status: Married    Spouse name: pete   Number of children: 4   Years of education: Not on file   Highest education level: Master's degree (e.g., MA, MS, MEng, MEd, MSW, MBA)  Occupational History   Not on file  Tobacco Use   Smoking status: Never    Passive exposure: Never   Smokeless tobacco: Never  Vaping Use   Vaping status: Never Used  Substance and Sexual Activity   Alcohol use: Yes    Alcohol/week: 4.0 standard drinks of alcohol    Types: 4 Glasses of wine per week    Comment: Socially when with friends   Drug use: Never   Sexual activity: Not Currently    Comment: Hysterectomy  Other Topics Concern   Not on file  Social History Narrative   Married   Right handed   Caffeine- 2 cups in the morning   Social Determinants of Health   Financial Resource Strain: Low Risk  (06/19/2023)   Overall Financial Resource Strain (CARDIA)    Difficulty of Paying Living Expenses: Not hard at all  Food Insecurity: Low Risk  (08/25/2023)   Received from [facility]   Hunger Vital Sign    Worried About Running Out of Food in the Last Year: Never true    Ran Out of Food in the Last Year: Never true  Transportation Needs: No Transportation Needs (08/25/2023)   Received from [facility]    In the past 12 months, has lack of reliable transportation kept you from medical appointments, meetings, work or from getting things needed for daily living? : No  Physical Activity: Inactive (06/19/2023)   Exercise Vital Sign    Days of Exercise per Week: 0 days    Minutes of Exercise per Session: 0 min  Stress: No Stress Concern Present (06/19/2023)   [employer] of Occupational Health - Occupational Stress Questionnaire    Feeling of Stress : Not at all  Social Connections: Socially Integrated (06/19/2023)   Social Connection and Isolation Panel [NHANES]    Frequency of Communication with Friends and Family: More than three times a week    Frequency of Social  Gatherings with Friends and Family: More than three times a week    Attends Religious Services: More than 4 times per year    Active Member of [employer] or Organizations: Yes    Attends Banker Meetings: More than 4 times per year  Marital Status: Living with partner  Intimate Partner Violence: Not At Risk (06/19/2023)   Humiliation, Afraid, Rape, and Kick questionnaire    Fear of Current or Ex-Partner: No    Emotionally Abused: No    Physically Abused: No    Sexually Abused: No      Levert Feinstein, M.D. Ph.D.  [facility] [address] [place], [place] [address] Ph: [phone] Fax: [phone]  CC:  Miguel Aschoff, MD [address] [place],  [place] [address]  Miguel Aschoff, MD

## 2023-09-20 DIAGNOSIS — E65 Localized adiposity: Secondary | ICD-10-CM | POA: Insufficient documentation

## 2023-09-20 MED ORDER — LEVOTHYROXINE SODIUM 25 MCG PO TABS: 25.0000 ug | ORAL_TABLET | Freq: Every day | ORAL | 3 refills | Status: DC

## 2023-09-20 NOTE — Assessment & Plan Note (Signed)
Desires guided sustainable weight loss plan (note bmi not elevated but she does have central weight).  Referred to weight management clinic.

## 2023-09-20 NOTE — Assessment & Plan Note (Signed)
Recent episode of emotional irritability, mood change, and strange head sensations upon not taking duloxetine for several days (camping; had forgotten to bring it).  Symptoms resolved upon resuming.  Noted for historical reference.

## 2023-09-20 NOTE — Assessment & Plan Note (Addendum)
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  The frequency of outbreaks has increased since early 2024 - she questions whether starting any new meds may be contributing.  She has otherwise noticed a correlation with outbreaks and increased dietary sugar.  Lesions begin as single painful small red bumps (not blisters) and are usually on her tongue - either surface or side.  Never on lips or buccal mucosa.  No lesions in mouth today.  We looked up photos of various oral ulcers and she identifies the herpes lesions as the ones she has.  She has not had anything like aphthous ulcers or cold sores.

## 2023-09-25 ENCOUNTER — Encounter: Payer: Self-pay | Admitting: Internal Medicine

## 2023-10-01 ENCOUNTER — Other Ambulatory Visit: Payer: Self-pay | Admitting: Internal Medicine

## 2023-10-01 MED ORDER — COVID-19 AT HOME ANTIGEN TEST VI KIT: PACK | 2 refills | Status: DC

## 2023-10-20 NOTE — Procedures (Signed)
Full Name: Gwendolyn Perez Gender: Female MRN #: [id number] Date of Birth: [birth year]    Visit Date: 09/17/2023 09:22 Age: 77 Years Examining Physician: Dr. Levert Feinstein Referring Physician: Dr. Levert Feinstein Height: 5 feet 4 inch History: 77 year old female complains of chronic neck pain, radiating pain to left upper extremity, bilateral lower extremity paresthesia  Summary of the test: Nerve conduction study: Bilateral sural, superficial peroneal sensory responses were normal.  Bilateral tibial, peroneal to EDB motor responses were normal.  Left median sensory responses showed mildly prolonged peak latency with normal snap amplitude.  Left median motor responses were within normal limit.  Left ulnar sensory and motor responses were normal. Bilateral tibial H reflexes were present and symmetric.  Electromyography: Selected needle examinations of left upper and lower extremity muscles, cervical and lumbosacral paraspinal muscles were normal.  Conclusion: This is an abnormal study.  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    ------------------------------- Levert Feinstein, M.D. PhD  [facility] [address] [place], [place] [address] Tel: [phone] Fax: [phone]  Verbal informed consent was obtained from the patient, patient was informed of potential risk of procedure, including bruising, bleeding, hematoma formation, infection, muscle weakness, muscle pain, numbness, among others.        MNC    Nerve / Sites Muscle Latency Ref. Amplitude Ref. Rel Amp Segments Distance Velocity Ref. Area    ms ms mV mV %  cm m/s m/s mVms  L Median - APB     Wrist APB 4.1 <=4.4 4.3 >=4.0 100 Wrist - APB 7   13.4     Upper arm APB 8.4  4.4  101 Upper arm - Wrist 22.6 53 >=49 14.4   L Ulnar - ADM     Wrist ADM 2.5 <=3.3 5.0 >=6.0 100 Wrist - ADM 7   17.4     B.Elbow ADM 4.5  4.4  89.8 B.Elbow - Wrist 12.6 65 >=49 16.1     A.Elbow ADM 6.6  4.4  99.3 A.Elbow - B.Elbow 11 52 >=49 14.7  R Peroneal - EDB     Ankle EDB 4.3 <=6.5 6.2 >=2.0 100 Ankle - EDB 9   19.3     Fib head EDB 9.6  5.6  91 Fib head - Ankle 25 47 >=44 18.3     Pop fossa EDB 11.7  5.5  96.9 Pop fossa - Fib head 9.6 46 >=44 17.6         Pop fossa - Ankle      L Peroneal - EDB     Ankle EDB 4.4 <=6.5 3.8 >=2.0 100 Ankle - EDB 9   11.1     Fib head EDB 10.0  3.6  94.6 Fib head - Ankle 25 45 >=44 11.2     Pop fossa EDB 12.1  5.0  139 Pop fossa - Fib head 10.6 50 >=44 15.8         Pop fossa - Ankle      R Tibial - AH     Ankle AH 4.9 <=5.8 10.4 >=4.0 100 Ankle - AH 9   20.5     Pop fossa AH 12.2  7.2  68.9 Pop fossa - Ankle 36 50 >=41 16.8  L Tibial - AH     Ankle AH 4.2 <=5.8 6.7 >=  4.0 100 Ankle - AH 9   15.2     Pop fossa AH 11.9  5.1  76.5 Pop fossa - Ankle 35 45 >=41 14.1                 SNC    Nerve / Sites Rec. Site Peak Lat Ref.  Amp Ref. Segments Distance    ms ms V V  cm  R Sural - Ankle (Calf)     Calf Ankle 3.9 <=4.4 13 >=6 Calf - Ankle 14  L Sural - Ankle (Calf)     Calf Ankle 4.1 <=4.4 6 >=6 Calf - Ankle 14  R Superficial peroneal - Ankle     Lat leg Ankle 3.6 <=4.4 13 >=6 Lat leg - Ankle 14  L Superficial peroneal - Ankle     Lat leg Ankle 3.7 <=4.4 7 >=6 Lat leg - Ankle 14  L Median - Orthodromic (Dig II, Mid palm)     Dig II Wrist 3.8 <=3.4 16 >=10 Dig II - Wrist 13  L Ulnar - Orthodromic, (Dig V, Mid palm)     Dig V Wrist 2.7 <=3.1 9 >=5 Dig V - Wrist 22                 F  Wave    Nerve F Lat Ref.   ms ms  R Tibial - AH 49.5 <=56.0  L Tibial - AH 50.3 <=56.0  L Ulnar - ADM 25.3 <=32.0           H Reflex    Nerve H Lat Lat Hmax   ms ms   Left Right Ref. Left Right Ref.  Tibial - Soleus 33.2 37.6 <=35.0 30.8 35.2 <=35.0  Tibial - Soleus 39.5 39.5 <=35.0 37.2 35.5  <=35.0           EMG Summary Table    Spontaneous MUAP Recruitment  Muscle IA Fib PSW Fasc Other Amp Dur. Poly Pattern  L. Tibialis anterior Normal None None None _______ Normal Normal Normal Normal  L. Tibialis posterior Normal None None None _______ Normal Normal Normal Normal  L. Peroneus longus Normal None None None _______ Normal Normal Normal Normal  L. Gastrocnemius (Medial head) Normal None None None _______ Normal Normal Normal Normal  L. Vastus lateralis Normal None None None _______ Normal Normal Normal Normal  L. Lumbar paraspinals (low) Normal None None None _______ Normal Normal Normal Normal  L. Lumbar paraspinals (mid) Normal None None None _______ Normal Normal Normal Normal  L. First dorsal interosseous Normal None None None _______ Normal Normal Normal Normal  L. Pronator teres Normal None None None _______ Normal Normal Normal Normal  L. Biceps brachii Normal None None None _______ Normal Normal Normal Normal  L. Deltoid Normal None None None _______ Normal Normal Normal Normal  L. Triceps brachii Normal None None None _______ Normal Normal Normal Normal  L. Cervical paraspinals Normal None None None _______ Normal Normal Normal Normal

## 2023-10-28 ENCOUNTER — Encounter: Payer: Self-pay | Admitting: Podiatry

## 2023-10-28 ENCOUNTER — Ambulatory Visit: Payer: Medicare Other | Admitting: Podiatry

## 2023-10-28 DIAGNOSIS — B351 Tinea unguium: Secondary | ICD-10-CM

## 2023-10-28 DIAGNOSIS — M205X1 Other deformities of toe(s) (acquired), right foot: Secondary | ICD-10-CM

## 2023-10-28 DIAGNOSIS — L84 Corns and callosities: Secondary | ICD-10-CM

## 2023-10-28 DIAGNOSIS — A499 Bacterial infection, unspecified: Secondary | ICD-10-CM | POA: Diagnosis not present

## 2023-10-28 DIAGNOSIS — L603 Nail dystrophy: Secondary | ICD-10-CM | POA: Diagnosis not present

## 2023-10-28 DIAGNOSIS — L608 Other nail disorders: Secondary | ICD-10-CM | POA: Diagnosis not present

## 2023-10-28 MED ORDER — TERBINAFINE HCL 250 MG PO TABS: 250.0000 mg | ORAL_TABLET | Freq: Every day | ORAL | 0 refills | Status: AC

## 2023-10-29 DIAGNOSIS — B351 Tinea unguium: Secondary | ICD-10-CM | POA: Diagnosis not present

## 2023-10-29 NOTE — Progress Notes (Signed)
Chief Complaint  Patient presents with   Nail Problem    Hallux left - thick, discolored nail x 3-4 weeks, not sore, concerned she may have a fungus, also has questions about the arthritis in her toes   New Patient (Initial Visit)    Est pt 09/2022    HPI: 76 y.o. female presents today with concern for fungal changes to the toenails.  Most affected toenails are the bilateral great toes, left greater than right, left fourth toe, right fifth toe.  The toenails are thickened, elongated, dystrophic with similar debris, tender with pressure from shoe gear.  She also complains of callus to the right first toe.  Denies any nausea, vomiting, fever, chills, chest pain, shortness of breath.  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       Obstructive sleep apnea (adult) (pediatric) 02/20/2022   Thyroid disease Hypothyroidism   Mildly affected    Past Surgical History:  Procedure Laterality Date   ABDOMINAL HYSTERECTOMY  2000   By laparoscopic surgery    Allergies  Allergen Reactions   Black Cohosh Rash   Sulfa Antibiotics Rash    ROS negative except as stated in HPI   Physical Exam: There were no vitals filed for this visit.  General: The patient is alert and oriented x3 in no acute distress.  Dermatology: Skin is warm, dry and supple bilateral lower extremities. Interspaces are clear of maceration and debris.  Fungal changes are present to the bilateral great toenails, left fourth toenail, right fifth toenail notable for increased thickness, length, discoloration, dystrophic changes concerning for mycotic infection.  There is  also hyperkeratotic tissue to the right hallux interphalangeal joint medial aspect and subsecond metatarsal head region bilaterally.  Vascular: Palpable pedal pulses bilaterally. Capillary refill within normal limits.  No appreciable edema.  No erythema or calor.  Neurological: Light touch sensation grossly intact bilateral feet.   Musculoskeletal Exam: Muscle strength 5/5 in dorsiflexion, eversion, inversion, eversion.  Functional hallux limitus noted to the right first MPJ.  Some angulation to the second toe at the DIPJ however this is overall nonpainful.   Assessment/Plan of Care: 1. Onychomycosis   2. Hallux limitus of right foot   3. Callus of foot      Meds ordered this encounter  Medications   terbinafine (LAMISIL) 250 MG tablet    Sig: Take 1 tablet (250 mg total) by mouth daily.    Dispense:  90 tablet    Refill:  0   None  Discussed clinical findings with patient today.  # Onychomycosis and preulcerative callus -Affected toenails were debrided in thickness and length using aseptic nail nippers -Left hallux nail sent for pathological analysis for onychomycosis -Obtaining CBC and hepatic function panel -Plan to start oral terbinafine for 90 days, will notify patient if any pertinent lab work is concerning for elevated liver enzymes -Patient will return to clinic in 45 days for this to assess efficacy of the terbinafine -Preulcerative callus pared down using 312 scalpel blade across the  # Functional hallux limitus right side -Power step orthotics dispensed patient with -Discussed  good supportive shoe gear with stiff soles to limit need for hallux range of motion -Consider radiographs at follow-up visit.   Arnet Hofferber L. Marchia Bond, AACFAS [facility]     [address] [place], [place] [address]                Office [phone]  Fax [phone]

## 2023-10-30 LAB — CBC WITH DIFFERENTIAL/PLATELET
Absolute Lymphocytes: 1842 {cells}/uL (ref 850–3900)
Absolute Monocytes: 516 {cells}/uL (ref 200–950)
Basophils Absolute: 42 {cells}/uL (ref 0–200)
Basophils Relative: 0.7 %
Eosinophils Absolute: 132 {cells}/uL (ref 15–500)
Eosinophils Relative: 2.2 %
HCT: 44.2 % (ref 35.0–45.0)
Hemoglobin: 14.8 g/dL (ref 11.7–15.5)
MCH: 31.2 pg (ref 27.0–33.0)
MCHC: 33.5 g/dL (ref 32.0–36.0)
MCV: 93.2 fL (ref 80.0–100.0)
MPV: 9.9 fL (ref 7.5–12.5)
Monocytes Relative: 8.6 %
Neutro Abs: 3468 {cells}/uL (ref 1500–7800)
Neutrophils Relative %: 57.8 %
Platelets: 313 10*3/uL (ref 140–400)
RBC: 4.74 10*6/uL (ref 3.80–5.10)
RDW: 11.6 % (ref 11.0–15.0)
Total Lymphocyte: 30.7 %
WBC: 6 10*3/uL (ref 3.8–10.8)

## 2023-10-30 LAB — HEPATIC FUNCTION PANEL
AG Ratio: 2 (calc) (ref 1.0–2.5)
ALT: 14 U/L (ref 6–29)
AST: 17 U/L (ref 10–35)
Albumin: 4.6 g/dL (ref 3.6–5.1)
Alkaline phosphatase (APISO): 104 U/L (ref 37–153)
Bilirubin, Direct: 0.1 mg/dL (ref 0.0–0.2)
Globulin: 2.3 g/dL (ref 1.9–3.7)
Indirect Bilirubin: 0.3 mg/dL (ref 0.2–1.2)
Total Bilirubin: 0.4 mg/dL (ref 0.2–1.2)
Total Protein: 6.9 g/dL (ref 6.1–8.1)

## 2023-11-10 ENCOUNTER — Other Ambulatory Visit: Payer: Self-pay | Admitting: Podiatry

## 2023-12-15 ENCOUNTER — Encounter: Payer: Self-pay | Admitting: Internal Medicine

## 2023-12-18 ENCOUNTER — Ambulatory Visit: Payer: Medicare Other | Admitting: Podiatry

## 2023-12-25 DIAGNOSIS — H31001 Unspecified chorioretinal scars, right eye: Secondary | ICD-10-CM | POA: Diagnosis not present

## 2023-12-25 DIAGNOSIS — H25813 Combined forms of age-related cataract, bilateral: Secondary | ICD-10-CM | POA: Diagnosis not present

## 2023-12-25 DIAGNOSIS — H5203 Hypermetropia, bilateral: Secondary | ICD-10-CM | POA: Diagnosis not present

## 2024-01-05 ENCOUNTER — Ambulatory Visit: Payer: Self-pay | Admitting: Surgery

## 2024-01-05 DIAGNOSIS — K644 Residual hemorrhoidal skin tags: Secondary | ICD-10-CM | POA: Diagnosis not present

## 2024-01-05 DIAGNOSIS — K643 Fourth degree hemorrhoids: Secondary | ICD-10-CM | POA: Diagnosis not present

## 2024-01-05 DIAGNOSIS — K641 Second degree hemorrhoids: Secondary | ICD-10-CM | POA: Diagnosis not present

## 2024-01-29 DIAGNOSIS — Z1231 Encounter for screening mammogram for malignant neoplasm of breast: Secondary | ICD-10-CM | POA: Diagnosis not present

## 2024-03-01 ENCOUNTER — Encounter: Payer: Self-pay | Admitting: Plastic Surgery

## 2024-03-01 ENCOUNTER — Telehealth: Payer: Self-pay | Admitting: *Deleted

## 2024-03-01 ENCOUNTER — Ambulatory Visit: Payer: Medicare Other | Admitting: Plastic Surgery

## 2024-03-01 VITALS — BP 118/72 | HR 79 | Ht 64.0 in | Wt 147.4 lb

## 2024-03-01 DIAGNOSIS — R519 Headache, unspecified: Secondary | ICD-10-CM

## 2024-03-01 DIAGNOSIS — N62 Hypertrophy of breast: Secondary | ICD-10-CM | POA: Diagnosis not present

## 2024-03-01 DIAGNOSIS — M546 Pain in thoracic spine: Secondary | ICD-10-CM | POA: Diagnosis not present

## 2024-03-01 DIAGNOSIS — M542 Cervicalgia: Secondary | ICD-10-CM

## 2024-03-01 NOTE — Telephone Encounter (Unsigned)
 Faxed order,demographics,insurance information,and recent office notes to [facility] for supplies for the patient.  Confirmation received and copy scanned into the chart.//AB/CMA

## 2024-03-01 NOTE — Progress Notes (Signed)
 Referring Provider Gwendolyn Cairo, MD [address], [address] [place],  [place] [address]   CC:  Chief Complaint  Patient presents with   Consult      Gwendolyn Perez is an 78 y.o. female.  HPI: Gwendolyn Perez is a very pleasant 78 year old female presents today with complaints of upper back and neck pain specifically radiculopathy which radiates into her left arm and also causes headaches.  She is interested in knowing whether a breast reduction would be of benefit to her.  She denies any specific complaints with her breast other than she feels they are heavy.  Allergies  Allergen Reactions   Black Cohosh Rash   Sulfa Antibiotics Rash    Outpatient Encounter Medications as of 03/01/2024  Medication Sig   acetaminophen (TYLENOL) 500 MG tablet 2 tablets as needed   Cholecalciferol (VITAMIN D3) 1000 units CAPS Take 1 capsule by mouth daily.   COVID-19 At Home Antigen Test KIT Use as directed.   DULoxetine (CYMBALTA) 30 MG capsule Take 1 every morning.  Take 1 every evening as needed for anxiety, pain.   estradiol (ESTRACE) 0.1 MG/GM vaginal cream Place 1 Applicatorful vaginally daily.   gabapentin (NEURONTIN) 300 MG capsule Take 1 capsule (300 mg total) by mouth 3 (three) times daily as needed.   levothyroxine (SYNTHROID) 25 MCG tablet Take 1 tablet (25 mcg total) by mouth daily before breakfast.   omeprazole (PRILOSEC) 20 MG capsule Take 1 capsule (20 mg total) by mouth daily.   PFIZER COVID-19 VAC BIVALENT injection    valACYclovir (VALTREX) 1000 MG tablet Take 0.5 tablets (500 mg total) by mouth daily as needed (herpes stomatitis).   No facility-administered encounter medications on file as of 03/01/2024.     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       Obstructive sleep apnea (adult) (pediatric) 02/20/2022   Thyroid disease Hypothyroidism   Mildly affected    Past Surgical History:  Procedure Laterality Date   ABDOMINAL HYSTERECTOMY  2000   By laparoscopic surgery    Family History  Problem Relation Age of Onset   Gwendolyn Perez    Gwendolyn Perez    Hearing loss Gwendolyn Perez    Arthritis Gwendolyn Perez    Arthritis Gwendolyn Perez    Hearing loss Gwendolyn Perez     Social History   Social History Narrative   Married   Right handed   Caffeine- 2 cups in the morning     Review of Systems General: Denies fevers, chills, weight loss CV: Denies chest pain, shortness of breath, palpitations Breast: Patient has upper back and neck pain which she feels may be due to her breast size.  Physical Exam    03/01/2024    8:16 AM 09/17/2023    9:31 AM 09/16/2023   10:09 AM  Vitals with BMI  Height 5\' 4"  5\' 4"  5\' 4"   Weight 147 lbs 6 oz 149 lbs 155 lbs 11 oz  BMI 25.29 25.56 26.71  Systolic 118 107 604  Diastolic 72 69 79  Pulse 79 80 79    General:  No acute distress,  Alert and oriented, Non-Toxic, Normal speech and affect Breast: Patient has breast appropriate for her body size.  Nipple position is approximately grade 2 ptosis.  There are no dominant masses on physical exam and the nipples are normal in appearance without evidence of nipple discharge Mammogram: Last documented mammogram was 2017 and was BI-RADS 2 Assessment/Plan Macromastia: Patient is here for evaluation for possible breast reduction.  We discussed what her goals were for breast reduction and they are primarily for relief of neck pain especially the neck pain which radiates into her head and causes headaches.  On physical examination at most I believe that I could remove 400 g per breast.  I do not believe that she would  derive any significant benefit from a breast reduction.  We have discussed this.  Additionally I do not believe that she would have a significant cosmetic improvement as her breast already have a quite nice shape and appearance.  I have recommended that she not undergo a breast reduction.  We did discuss the fact that she might benefit from a refitting of her bra.  She has not done this for many years.  She was given a prescription for second to nature as an option for bra fitting.  Follow-up as needed  Gwendolyn Perez 03/01/2024, 8:40 AM

## 2024-03-02 DIAGNOSIS — H6123 Impacted cerumen, bilateral: Secondary | ICD-10-CM | POA: Diagnosis not present

## 2024-03-02 DIAGNOSIS — H903 Sensorineural hearing loss, bilateral: Secondary | ICD-10-CM | POA: Diagnosis not present

## 2024-03-17 ENCOUNTER — Encounter: Payer: Self-pay | Admitting: Internal Medicine

## 2024-03-17 ENCOUNTER — Telehealth: Payer: Self-pay | Admitting: *Deleted

## 2024-03-17 NOTE — Telephone Encounter (Signed)
 Copied from CRM [id number]. Topic: Clinical - Medication Question >> Mar 17, 2024 12:09 PM Dennison Nancy wrote: Reason for CRM: megan call from  [facility] patient was referred and want  [facility]  patient need all her 9 medications send over  [address] [place] [address] phone number [phone] fax number [phone] Aundra Millet from [place] [facility] will be faxing over request to regarding changing pharmacy and verifying all 9 of patient medications

## 2024-03-17 NOTE — Telephone Encounter (Signed)
 Will sent to PCP and await fax for medication Clarification to be sent.

## 2024-03-20 ENCOUNTER — Other Ambulatory Visit: Payer: Self-pay | Admitting: Internal Medicine

## 2024-03-20 DIAGNOSIS — R52 Pain, unspecified: Secondary | ICD-10-CM

## 2024-03-20 DIAGNOSIS — M542 Cervicalgia: Secondary | ICD-10-CM

## 2024-03-20 MED ORDER — DULOXETINE HCL 30 MG PO CPEP: ORAL_CAPSULE | ORAL | 3 refills | Status: DC

## 2024-03-20 MED ORDER — DULOXETINE HCL 30 MG PO CPEP: 30.0000 mg | ORAL_CAPSULE | Freq: Every morning | ORAL | 3 refills | Status: DC

## 2024-03-20 MED ORDER — DULOXETINE HCL 60 MG PO CPEP: 60.0000 mg | ORAL_CAPSULE | Freq: Every evening | ORAL | 3 refills | Status: DC

## 2024-03-20 MED ORDER — DULOXETINE HCL 60 MG PO CPEP: 60.0000 mg | ORAL_CAPSULE | Freq: Every day | ORAL | 3 refills | Status: DC

## 2024-03-22 ENCOUNTER — Other Ambulatory Visit: Payer: Self-pay | Admitting: Internal Medicine

## 2024-03-22 ENCOUNTER — Encounter: Payer: Self-pay | Admitting: Internal Medicine

## 2024-03-22 ENCOUNTER — Telehealth: Payer: Self-pay | Admitting: *Deleted

## 2024-03-22 DIAGNOSIS — R52 Pain, unspecified: Secondary | ICD-10-CM

## 2024-03-22 DIAGNOSIS — R12 Heartburn: Secondary | ICD-10-CM

## 2024-03-22 DIAGNOSIS — E063 Autoimmune thyroiditis: Secondary | ICD-10-CM

## 2024-03-22 MED ORDER — VITAMIN D3 1000 UNITS PO CAPS: 1.0000 | ORAL_CAPSULE | Freq: Every day | ORAL | 3 refills | Status: DC

## 2024-03-22 MED ORDER — DULOXETINE HCL 60 MG PO CPEP: 60.0000 mg | ORAL_CAPSULE | Freq: Every morning | ORAL | 3 refills | Status: DC

## 2024-03-22 MED ORDER — LEVOTHYROXINE SODIUM 25 MCG PO TABS: 25.0000 ug | ORAL_TABLET | Freq: Every day | ORAL | 3 refills | Status: DC

## 2024-03-22 MED ORDER — OMEPRAZOLE 20 MG PO CPDR: 20.0000 mg | DELAYED_RELEASE_CAPSULE | Freq: Every day | ORAL | 3 refills | Status: DC

## 2024-03-22 MED ORDER — DULOXETINE HCL 60 MG PO CPEP
60.0000 mg | ORAL_CAPSULE | Freq: Every morning | ORAL | 3 refills | Status: DC
Start: 2024-03-22 — End: 2024-03-22

## 2024-03-22 MED ORDER — ESTRADIOL 0.1 MG/GM VA CREA: 1.0000 | TOPICAL_CREAM | VAGINAL | 6 refills | Status: AC

## 2024-03-22 MED ORDER — DULOXETINE HCL 30 MG PO CPEP: 30.0000 mg | ORAL_CAPSULE | Freq: Every evening | ORAL | 3 refills | Status: DC

## 2024-03-22 MED ORDER — GABAPENTIN 300 MG PO CAPS: 300.0000 mg | ORAL_CAPSULE | Freq: Three times a day (TID) | ORAL | 11 refills | Status: AC | PRN

## 2024-03-22 NOTE — Telephone Encounter (Signed)
 I called pt who stated she will be using [facility] for her meds. Stated she does not need any refills at this time.

## 2024-03-22 NOTE — Telephone Encounter (Signed)
 Copied from CRM [id number]. Topic: General - Other >> Mar 19, 2024  4:54 PM Brynn Caras wrote: Reason for CRM: [facility] wanted to confirm if the fax sent on 04/09 on behalf of this patient has been received. Advised as of now it has not been received. Re-confirmed the office's fax number to ensure it's sent properly provided an estimated turn-around for this to be faxed over to their office. Adah Acron will re-fax over this documentation today. All future medications for this patient will be sent to: [address], [place], [place] [address] Callback #: [phone]  Fax #: [phone]

## 2024-04-05 ENCOUNTER — Other Ambulatory Visit: Payer: Self-pay | Admitting: Internal Medicine

## 2024-04-05 DIAGNOSIS — R52 Pain, unspecified: Secondary | ICD-10-CM

## 2024-04-05 MED ORDER — ACETAMINOPHEN 500 MG PO TABS: 1000.0000 mg | ORAL_TABLET | Freq: Every evening | ORAL | 3 refills | Status: DC

## 2024-04-12 ENCOUNTER — Other Ambulatory Visit: Payer: Self-pay | Admitting: Internal Medicine

## 2024-04-12 ENCOUNTER — Telehealth: Payer: Self-pay | Admitting: Internal Medicine

## 2024-04-12 NOTE — Telephone Encounter (Unsigned)
 Copied from CRM [id number]. Topic: Clinical - Medication Refill >> Apr 12, 2024 11:24 AM Tisa Forester wrote: Most Recent Primary Care Visit:  Provider: WILLIAMS, JULIE ANNE  Department: [facility]  Visit Type: OPEN ESTABLISHED  Date: 09/16/2023  Medication: Turmeric with pepper 2250 mg,  Has the patient contacted their pharmacy? Yes (Agent: If no, request that the patient contact the pharmacy for the refill. If patient does not wish to contact the pharmacy document the reason why and proceed with request.) (Agent: If yes, when and what did the pharmacy advise?)  Is this the correct pharmacy for this prescription? Yes If no, delete pharmacy and type the correct one.  This is the patient's preferred pharmacy:   [facility], [place] - [address] [place] [address] Phone: [phone] Fax: [phone]   Has the prescription been filled recently? No  Is the patient out of the medication? Yes , [facility]  Has the patient been seen for an appointment in the last year OR does the patient have an upcoming appointment? Yes  Can we respond through MyChart? No  Agent: Please be advised that Rx refills may take up to 3 business days. We ask that you follow-up with your pharmacy.

## 2024-04-12 NOTE — Telephone Encounter (Signed)
Medication is not on current med list.

## 2024-04-14 NOTE — Telephone Encounter (Signed)
 Called pt who stated she had heard from Dr Broadus Canes about Turmeric and black pepper. And she has received it from [facility].

## 2024-04-29 DIAGNOSIS — M8588 Other specified disorders of bone density and structure, other site: Secondary | ICD-10-CM | POA: Diagnosis not present

## 2024-05-26 DIAGNOSIS — K643 Fourth degree hemorrhoids: Secondary | ICD-10-CM | POA: Diagnosis not present

## 2024-05-26 DIAGNOSIS — K644 Residual hemorrhoidal skin tags: Secondary | ICD-10-CM | POA: Diagnosis not present

## 2024-05-26 DIAGNOSIS — K648 Other hemorrhoids: Secondary | ICD-10-CM | POA: Diagnosis not present

## 2024-05-26 DIAGNOSIS — K649 Unspecified hemorrhoids: Secondary | ICD-10-CM | POA: Diagnosis not present

## 2024-06-14 DIAGNOSIS — L814 Other melanin hyperpigmentation: Secondary | ICD-10-CM | POA: Diagnosis not present

## 2024-06-14 DIAGNOSIS — L218 Other seborrheic dermatitis: Secondary | ICD-10-CM | POA: Diagnosis not present

## 2024-06-14 DIAGNOSIS — L565 Disseminated superficial actinic porokeratosis (DSAP): Secondary | ICD-10-CM | POA: Diagnosis not present

## 2024-06-14 DIAGNOSIS — L821 Other seborrheic keratosis: Secondary | ICD-10-CM | POA: Diagnosis not present

## 2024-06-14 DIAGNOSIS — L570 Actinic keratosis: Secondary | ICD-10-CM | POA: Diagnosis not present

## 2024-06-14 DIAGNOSIS — D225 Melanocytic nevi of trunk: Secondary | ICD-10-CM | POA: Diagnosis not present

## 2024-07-05 DIAGNOSIS — M65321 Trigger finger, right index finger: Secondary | ICD-10-CM | POA: Diagnosis not present

## 2024-07-05 DIAGNOSIS — M65342 Trigger finger, left ring finger: Secondary | ICD-10-CM | POA: Diagnosis not present

## 2024-07-20 ENCOUNTER — Other Ambulatory Visit (HOSPITAL_COMMUNITY): Payer: Self-pay

## 2024-07-29 ENCOUNTER — Other Ambulatory Visit (HOSPITAL_COMMUNITY): Payer: Self-pay

## 2024-07-29 ENCOUNTER — Encounter: Payer: Self-pay | Admitting: Internal Medicine

## 2024-08-02 DIAGNOSIS — H6123 Impacted cerumen, bilateral: Secondary | ICD-10-CM | POA: Diagnosis not present

## 2024-08-03 ENCOUNTER — Other Ambulatory Visit: Payer: Self-pay

## 2024-08-03 ENCOUNTER — Encounter: Payer: Self-pay | Admitting: Internal Medicine

## 2024-08-04 ENCOUNTER — Other Ambulatory Visit: Payer: Self-pay

## 2024-08-04 DIAGNOSIS — M65321 Trigger finger, right index finger: Secondary | ICD-10-CM | POA: Diagnosis not present

## 2024-08-04 DIAGNOSIS — M65342 Trigger finger, left ring finger: Secondary | ICD-10-CM | POA: Diagnosis not present

## 2024-08-05 ENCOUNTER — Encounter: Payer: Self-pay | Admitting: Internal Medicine

## 2024-08-06 ENCOUNTER — Other Ambulatory Visit: Payer: Self-pay | Admitting: Internal Medicine

## 2024-08-06 ENCOUNTER — Encounter: Payer: Self-pay | Admitting: Internal Medicine

## 2024-08-06 ENCOUNTER — Other Ambulatory Visit (HOSPITAL_COMMUNITY): Payer: Self-pay

## 2024-08-06 DIAGNOSIS — R52 Pain, unspecified: Secondary | ICD-10-CM

## 2024-08-06 DIAGNOSIS — E063 Autoimmune thyroiditis: Secondary | ICD-10-CM

## 2024-08-06 DIAGNOSIS — R12 Heartburn: Secondary | ICD-10-CM

## 2024-08-06 DIAGNOSIS — M8580 Other specified disorders of bone density and structure, unspecified site: Secondary | ICD-10-CM

## 2024-08-06 DIAGNOSIS — B002 Herpesviral gingivostomatitis and pharyngotonsillitis: Secondary | ICD-10-CM

## 2024-08-06 MED ORDER — OMEPRAZOLE 20 MG PO CPDR
20.0000 mg | DELAYED_RELEASE_CAPSULE | Freq: Every day | ORAL | 11 refills | Status: AC
  Filled 2024-08-06 – 2024-10-04 (×7): qty 30, 30d supply, fill #0
  Filled 2024-10-28: qty 30, 30d supply, fill #1
  Filled 2024-11-29: qty 30, 30d supply, fill #2
  Filled 2024-12-29: qty 30, 30d supply, fill #3

## 2024-08-06 MED ORDER — VITAMIN D3 25 MCG (1000 UNIT) PO TABS
25.0000 ug | ORAL_TABLET | Freq: Every day | ORAL | 11 refills | Status: AC
  Filled 2024-08-06: qty 30, fill #0
  Filled 2024-08-11 – 2024-10-04 (×6): qty 30, 30d supply, fill #0
  Filled 2024-10-28: qty 30, 30d supply, fill #1
  Filled 2024-11-29: qty 30, 30d supply, fill #2
  Filled 2024-12-29: qty 30, 30d supply, fill #3

## 2024-08-06 MED ORDER — DULOXETINE HCL 30 MG PO CPEP
30.0000 mg | ORAL_CAPSULE | Freq: Every evening | ORAL | 11 refills | Status: DC
  Filled 2024-08-06 – 2024-09-07 (×3): qty 30, 30d supply, fill #0

## 2024-08-06 MED ORDER — DULOXETINE HCL 60 MG PO CPEP
60.0000 mg | ORAL_CAPSULE | Freq: Every morning | ORAL | 11 refills | Status: AC
  Filled 2024-08-06 – 2024-10-04 (×7): qty 30, 30d supply, fill #0
  Filled 2024-10-28: qty 30, 30d supply, fill #1
  Filled 2024-11-29: qty 30, 30d supply, fill #2
  Filled 2024-12-29: qty 30, 30d supply, fill #3

## 2024-08-06 MED ORDER — LEVOTHYROXINE SODIUM 25 MCG PO TABS
25.0000 ug | ORAL_TABLET | Freq: Every day | ORAL | 11 refills | Status: AC
  Filled 2024-08-06 – 2024-11-29 (×9): qty 30, 30d supply, fill #0
  Filled 2024-12-29: qty 30, 30d supply, fill #1

## 2024-08-06 MED ORDER — ACETAMINOPHEN 500 MG PO TABS
1000.0000 mg | ORAL_TABLET | Freq: Every evening | ORAL | 11 refills | Status: AC
  Filled 2024-08-06 – 2024-10-04 (×8): qty 60, 30d supply, fill #0
  Filled 2024-10-28: qty 60, 30d supply, fill #1

## 2024-08-11 ENCOUNTER — Other Ambulatory Visit: Payer: Self-pay

## 2024-08-11 ENCOUNTER — Other Ambulatory Visit (HOSPITAL_COMMUNITY): Payer: Self-pay

## 2024-08-13 ENCOUNTER — Other Ambulatory Visit: Payer: Self-pay

## 2024-08-16 ENCOUNTER — Other Ambulatory Visit: Payer: Self-pay | Admitting: Internal Medicine

## 2024-08-16 ENCOUNTER — Other Ambulatory Visit: Payer: Self-pay

## 2024-08-16 DIAGNOSIS — R52 Pain, unspecified: Secondary | ICD-10-CM

## 2024-08-18 ENCOUNTER — Other Ambulatory Visit: Payer: Self-pay

## 2024-08-18 ENCOUNTER — Ambulatory Visit

## 2024-08-18 ENCOUNTER — Other Ambulatory Visit (HOSPITAL_COMMUNITY): Payer: Self-pay

## 2024-08-18 VITALS — Ht 64.0 in | Wt 140.0 lb

## 2024-08-18 DIAGNOSIS — Z0000 Encounter for general adult medical examination without abnormal findings: Secondary | ICD-10-CM

## 2024-08-18 MED ORDER — ACETAMINOPHEN 500 MG PO TABS
1000.0000 mg | ORAL_TABLET | Freq: Every evening | ORAL | 3 refills | Status: AC
  Filled 2024-08-18: qty 180, 90d supply, fill #0

## 2024-08-18 NOTE — Progress Notes (Signed)
 Because this visit was a virtual/telehealth visit,  certain criteria was not obtained, such a blood pressure, CBG if applicable, and timed get up and go. Any medications not marked as taking were not mentioned during the medication reconciliation part of the visit. Any vitals not documented were not able to be obtained due to this being a telehealth visit or patient was unable to self-report a recent blood pressure reading due to a lack of equipment at home via telehealth. Vitals that have been documented are verbally provided by the patient.   Subjective:   Gwendolyn Perez is a 78 y.o. who presents for a Medicare Wellness preventive visit.  As a reminder, Annual Wellness Visits don't include a physical exam, and some assessments may be limited, especially if this visit is performed virtually. We may recommend an in-person follow-up visit with your provider if needed.  Visit Complete: Virtual I connected with  Gwendolyn Perez on 08/18/24 by a video and audio enabled telemedicine application and verified that I am speaking with the correct person using two identifiers. There was a video connection error and we had to complete via telephone visit.  Patient Location: Home  Provider Location: Office/Clinic  I discussed the limitations of evaluation and management by telemedicine. The patient expressed understanding and agreed to proceed.  Vital Signs: Because this visit was a virtual/telehealth visit, some criteria may be missing or patient reported. Any vitals not documented were not able to be obtained and vitals that have been documented are patient reported.  VideoError- Librarian, academic were attempted between this provider and patient, however failed, due to patient having technical difficulties OR patient did not have access to video capability.  We continued and completed visit with audio only.   Persons Participating in Visit: Husband and patient was present during  visit.  AWV Questionnaire: No: Patient Medicare AWV questionnaire was not completed prior to this visit.  Cardiac Risk Factors include: advanced age (>27men, >49 women);sedentary lifestyle;dyslipidemia     Objective:    Today's Vitals   08/18/24 1544  Weight: 140 lb (63.5 kg)  Height: 5' 4 (1.626 m)  PainSc: 0-No pain   Body mass index is 24.03 kg/m.     08/18/2024    3:54 PM 09/16/2023   11:58 AM 07/16/2023   11:08 AM 07/11/2023    1:02 PM 06/19/2023    3:28 PM 06/19/2023   10:09 AM 01/09/2023    8:52 AM  Advanced Directives  Does Patient Have a Medical Advance Directive? Yes Yes No No No Yes Yes  Type of Estate agent of [place];Living will Healthcare Power of [place];Living will   Healthcare Power of [place];Living will Healthcare Power of [place];Living will Living will;Healthcare Power of Attorney  Does patient want to make changes to medical advance directive?  No - Patient declined    No - Patient declined No - Patient declined  Copy of Healthcare Power of Attorney in Chart? No - copy requested No - copy requested   No - copy requested No - copy requested No - copy requested  Would patient like information on creating a medical advance directive?   No - Patient declined No - Patient declined No - Patient declined  No - Patient declined    Current Medications (verified) Outpatient Encounter Medications as of 08/18/2024  Medication Sig   acetaminophen  (TYLENOL ) 500 MG tablet Take 2 tablets (1,000 mg total) by mouth at bedtime.   acetaminophen  (TYLENOL ) 500 MG tablet Take 2  tablets (1,000 mg total) by mouth at bedtime.   cholecalciferol  (VITAMIN D3) 25 MCG (1000 UNIT) tablet Take 1 tablet (25 mcg total) by mouth daily.   DULoxetine  (CYMBALTA ) 30 MG capsule Take 1 capsule (30 mg total) by mouth every evening.   DULoxetine  (CYMBALTA ) 60 MG capsule Take 1 capsule (60 mg total) by mouth every morning.   gabapentin  (NEURONTIN ) 300 MG capsule Take 1 capsule  (300 mg total) by mouth 3 (three) times daily as needed.   levothyroxine  (SYNTHROID ) 25 MCG tablet Take 1 tablet (25 mcg total) by mouth daily before breakfast.   omeprazole  (PRILOSEC) 20 MG capsule Take 1 capsule (20 mg total) by mouth daily.   valACYclovir  (VALTREX ) 1000 MG tablet Take 0.5 tablets (500 mg total) by mouth daily as needed (herpes stomatitis).   No facility-administered encounter medications on file as of 08/18/2024.    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       Obstructive sleep apnea (adult) (pediatric) 02/20/2022   Thyroid  disease Hypothyroidism   Mildly affected   Past Surgical History:  Procedure Laterality Date   ABDOMINAL HYSTERECTOMY  2000   By laparoscopic surgery   Family History  Problem Relation Age of Onset   Healthy Mother    Cancer Father    Hearing loss Paternal Grandfather    Arthritis Paternal Grandmother    Arthritis Paternal Aunt    Hearing loss Paternal Aunt    Social History   Socioeconomic History   Marital status: Married    Spouse name: Gwendolyn Perez   Number of children: 4   Years of education: Not on file   Highest education level: Master's degree (e.g., MA, MS, MEng, MEd, MSW, MBA)  Occupational History   Not on file  Tobacco Use   Smoking status: Never    Passive exposure: Never   Smokeless tobacco: Never  Vaping Use   Vaping status: Never Used  Substance and Sexual Activity   Alcohol use: Yes    Alcohol/week: 4.0 standard drinks of alcohol    Types: 4 Glasses of wine per week    Comment: Socially when with  friends   Drug use: Never   Sexual activity: Not Currently    Comment: Hysterectomy  Other Topics Concern   Not on file  Social History Narrative   Married   Right handed   Caffeine- 2 cups in the morning   Social Drivers of Health   Financial Resource Strain: Low Risk  (08/18/2024)   Overall Financial Resource Strain (CARDIA)    Difficulty of Paying Living Expenses: Not hard at all  Food Insecurity: No Food Insecurity (08/18/2024)   Hunger Vital Sign    Worried About Running Out of Food in the Last Year: Never true    Ran Out of Food in the Last Year: Never true  Transportation Needs: No Transportation Needs (08/18/2024)   PRAPARE - Administrator, Civil Service (Medical): No    Lack of Transportation (Non-Medical): No  Physical Activity: Inactive (08/18/2024)   Exercise Vital Sign    Days  of Exercise per Week: 0 days    Minutes of Exercise per Session: 0 min  Stress: No Stress Concern Present (08/18/2024)   [employer] of Occupational Health - Occupational Stress Questionnaire    Feeling of Stress: Not at all  Social Connections: Socially Integrated (08/18/2024)   Social Connection and Isolation Panel    Frequency of Communication with Friends and Family: More than three times a week    Frequency of Social Gatherings with Friends and Family: More than three times a week    Attends Religious Services: More than 4 times per year    Active Member of [employer] or Organizations: Yes    Attends Engineer, structural: More than 4 times per year    Marital Status: Living with partner    Tobacco Counseling Counseling given: Not Answered    Clinical Intake:  Pre-visit preparation completed: Yes  Pain : No/denies pain Pain Score: 0-No pain     BMI - recorded: 24.03 Nutritional Status: BMI of 19-24  Normal Nutritional Risks: None Diabetes: No  Lab Results  Component Value Date   HGBA1C 5.7 (H) 12/26/2022     How often do you need to have someone  help you when you read instructions, pamphlets, or other written materials from your doctor or pharmacy?: 1 - Never What is the last grade level you completed in school?: MASTER'S DEGREE  Interpreter Needed?: No  Information entered by :: Jazalynn Mireles N. Leeann Bady, LPN.   Activities of Daily Living     08/18/2024    3:54 PM 09/16/2023   10:10 AM  In your present state of health, do you have any difficulty performing the following activities:  Hearing? 1 0  Comment HEARING AIDS   Vision? 0 0  Difficulty concentrating or making decisions? 0 0  Comment BSE: JIGSAW, READING, GAMES ON PHONE, GRADUATE LEVEL COLLEGE COURSE (1 WEEK FOR 2.5 HOURS)   Walking or climbing stairs? 0 0  Dressing or bathing? 0 0  Doing errands, shopping? 0 0  Preparing Food and eating ? N   Using the Toilet? N   In the past six months, have you accidently leaked urine? N   Do you have problems with loss of bowel control? N   Managing your Medications? N   Managing your Finances? N   Housekeeping or managing your Housekeeping? N     Patient Care Team: Trudy Mliss Dragon, MD as PCP - General (Internal Medicine) Rosalie Kitchens, MD as Consulting Physician (Gastroenterology) Sheldon Standing, MD as Consulting Physician (General Surgery) Onita Duos, MD as Consulting Physician (Neurology) [place] Mt, MD as Referring Physician (Physical Medicine and Rehabilitation) Robinson Mayo, OD as Referring Physician (Optometry)  I have updated your Care Teams any recent Medical Services you may have received from other providers in the past year.     Assessment:   This is a routine wellness examination for [place].  Hearing/Vision screen Hearing Screening - Comments:: Patient wears hearing aids.  Vision Screening - Comments:: Wears rx glasses/contacts - up to date with routine eye exams with Mayo Robinson, OD.    Goals Addressed             This Visit's Progress    My goals for 2025-2026:       I would like to get back  into walking. Weight goal of 135-138 pounds. Drink more water, without using the plastic water bottle.        Depression Screen     08/18/2024  3:57 PM 09/16/2023   11:59 AM 06/19/2023    3:27 PM 06/19/2023   10:15 AM 01/09/2023    8:52 AM 12/10/2022   11:08 AM 11/28/2022    3:51 PM  PHQ 2/9 Scores  PHQ - 2 Score 0 0 0 0 0 0 0  PHQ- 9 Score 0     0 0    Fall Risk     08/18/2024    3:54 PM 09/16/2023   10:10 AM 06/19/2023    3:28 PM 06/19/2023    9:58 AM 01/09/2023    8:52 AM  Fall Risk   Falls in the past year? 0 0 0 0 0  Number falls in past yr: 0 0 0 0 0  Injury with Fall? 0 0 0 0 0  Risk for fall due to : No Fall Risks  No Fall Risks    Follow up Falls evaluation completed Falls evaluation completed Falls evaluation completed Falls evaluation completed Falls evaluation completed    MEDICARE RISK AT HOME:  Medicare Risk at Home Any stairs in or around the home?: Yes (ATTIC) If so, are there any without handrails?: No Home free of loose throw rugs in walkways, pet beds, electrical cords, etc?: Yes Adequate lighting in your home to reduce risk of falls?: Yes Life alert?: No Use of a cane, walker or w/c?: No Grab bars in the bathroom?: Yes Shower chair or bench in shower?: No Elevated toilet seat or a handicapped toilet?: Yes  TIMED UP AND GO:  Was the test performed?  No  Cognitive Function: Declined/Normal: No cognitive concerns noted by patient or family. Patient alert, oriented, able to answer questions appropriately and recall recent events. No signs of memory loss or confusion.    08/18/2024    3:59 PM  MMSE - Mini Mental State Exam  Not completed: Unable to complete        08/18/2024    4:03 PM  6CIT Screen  What Year? 0 points  What month? 0 points  What time? 0 points  Count back from 20 0 points  Months in reverse 0 points  Repeat phrase 0 points  Total Score 0 points    Immunizations Immunization History  Administered Date(s) Administered    Fluad Quad(high Dose 65+) 09/03/2022   Fluzone Influenza virus vaccine,trivalent (IIV3), split virus 01/20/2017, 09/08/2017, 07/12/2019   INFLUENZA, HIGH DOSE SEASONAL PF 09/15/2018, 06/23/2020   Influenza-Unspecified 09/08/2020, 09/08/2021   PFIZER(Purple Top)SARS-COV-2 Vaccination 12/01/2019, 12/20/2019, 09/02/2020, 03/08/2021   PNEUMOCOCCAL CONJUGATE-20 11/14/2022   Pfizer(Comirnaty)Fall Seasonal Vaccine 12 years and older 02/22/2023   Pneumococcal Conjugate-13 01/13/2018   Pneumococcal Polysaccharide-23 01/25/2019   Pneumococcal-Unspecified 09/08/2020, 09/19/2021   RSV,unspecified 11/07/2022   Td 10/09/2018   Tdap 02/22/2023   Unspecified SARS-COV-2 Vaccination 09/13/2021, 03/12/2022, 09/28/2022   Zoster Recombinant(Shingrix) 08/24/2018, 12/16/2018   Zoster, Live 12/09/2013, 08/10/2018, 12/09/2018    Screening Tests Health Maintenance  Topic Date Due   Hepatitis C Screening  Never done   DEXA SCAN  Never done   Zoster Vaccines- Shingrix (2 of 2) 02/10/2019   Influenza Vaccine  07/09/2024   COVID-19 Vaccine (10 - 2024-25 season) 08/09/2024   Medicare Annual Wellness (AWV)  08/18/2025   DTaP/Tdap/Td (3 - Td or Tdap) 02/21/2033   Pneumococcal Vaccine: 50+ Years  Completed   HPV VACCINES  Aged Out   Meningococcal B Vaccine  Aged Out    Health Maintenance Items Addressed: Yes Patient aware of current care gaps.  Immunization record was verified by  [facility] and updated in patient's chart. Patient is due for Hepatitis C and Bone Density Scan.  Additional Screening:  Vision Screening: Recommended annual ophthalmology exams for early detection of glaucoma and other disorders of the eye. Is the patient up to date with their annual eye exam?  Yes  Who is the provider or what is the name of the office in which the patient attends annual eye exams? Selinda Reusing, OD.  Dental Screening: Recommended annual dental exams for proper oral hygiene  Community Resource Referral / Chronic Care  Management: CRR required this visit?  No   CCM required this visit?  No   Plan:    I have personally reviewed and noted the following in the patient's chart:   Medical and social history Use of alcohol, tobacco or illicit drugs  Current medications and supplements including opioid prescriptions. Patient is not currently taking opioid prescriptions. Functional ability and status Nutritional status Physical activity Advanced directives List of other physicians Hospitalizations, surgeries, and ER visits in previous 12 months Vitals Screenings to include cognitive, depression, and falls Referrals and appointments  In addition, I have reviewed and discussed with patient certain preventive protocols, quality metrics, and best practice recommendations. A written personalized care plan for preventive services as well as general preventive health recommendations were provided to patient.   Roz LOISE Fuller, LPN   [id number]   After Visit Summary: (MyChart) Due to this being a telephonic visit, the after visit summary with patients personalized plan was offered to patient via MyChart   Notes: Nothing significant to report at this time.

## 2024-08-18 NOTE — Patient Instructions (Addendum)
 Gwendolyn Perez,  Thank you for taking the time for your Medicare Wellness Visit. I appreciate your continued commitment to your health goals. Please review the care plan we discussed, and feel free to reach out if I can assist you further.  Medicare recommends these wellness visits once per year to help you and your care team stay ahead of potential health issues. These visits are designed to focus on prevention, allowing your provider to concentrate on managing your acute and chronic conditions during your regular appointments.  Please note that Annual Wellness Visits do not include a physical exam. Some assessments may be limited, especially if the visit was conducted virtually. If needed, we may recommend a separate in-person follow-up with your provider.  Ongoing Care Seeing your primary care provider every 3 to 6 months helps us  monitor your health and provide consistent, personalized care.   Referrals If a referral was made during today's visit and you haven't received any updates within two weeks, please contact the referred provider directly to check on the status.  Recommended Screenings:  Health Maintenance  Topic Date Due   Hepatitis C Screening  Never done   DEXA scan (bone density measurement)  Never done   Zoster (Shingles) Vaccine (2 of 2) 02/10/2019   Flu Shot  07/09/2024   COVID-19 Vaccine (10 - 2024-25 season) 08/09/2024   Medicare Annual Wellness Visit  08/18/2025   DTaP/Tdap/Td vaccine (3 - Td or Tdap) 02/21/2033   Pneumococcal Vaccine for age over 35  Completed   HPV Vaccine  Aged Out   Meningitis B Vaccine  Aged Out       08/18/2024    3:54 PM  Advanced Directives  Does Patient Have a Medical Advance Directive? Yes  Type of Estate agent of [place];Living will  Copy of Healthcare Power of Attorney in Chart? No - copy requested   Advance Care Planning is important because it: Ensures you receive medical care that aligns with your values,  goals, and preferences. Provides guidance to your family and loved ones, reducing the emotional burden of decision-making during critical moments.  Vision: Annual vision screenings are recommended for early detection of glaucoma, cataracts, and diabetic retinopathy. These exams can also reveal signs of chronic conditions such as diabetes and high blood pressure.  Dental: Annual dental screenings help detect early signs of oral cancer, gum disease, and other conditions linked to overall health, including heart disease and diabetes.  Please see the attached documents for additional preventive care recommendations.

## 2024-08-20 DIAGNOSIS — Z23 Encounter for immunization: Secondary | ICD-10-CM | POA: Diagnosis not present

## 2024-08-23 ENCOUNTER — Other Ambulatory Visit: Payer: Self-pay

## 2024-08-27 ENCOUNTER — Other Ambulatory Visit: Payer: Self-pay

## 2024-08-27 ENCOUNTER — Telehealth: Payer: Self-pay | Admitting: *Deleted

## 2024-08-27 NOTE — Telephone Encounter (Signed)
 Copied from CRM [id number]. Topic: Clinical - Medication Question >> Aug 27, 2024 11:38 AM Susanna ORN wrote: Reason for CRM: Olita, with [facility], calling to request OTCs to be put on prescriptions so that they can package and deliver to patient. He would like to have Magnesium 150 mg and Tumeric with pepper 2250 as prescriptions sent over to them.

## 2024-08-31 ENCOUNTER — Other Ambulatory Visit (HOSPITAL_COMMUNITY): Payer: Self-pay

## 2024-08-31 ENCOUNTER — Other Ambulatory Visit: Payer: Self-pay

## 2024-09-02 ENCOUNTER — Other Ambulatory Visit: Payer: Self-pay

## 2024-09-03 ENCOUNTER — Other Ambulatory Visit: Payer: Self-pay

## 2024-09-03 ENCOUNTER — Encounter: Payer: Self-pay | Admitting: Internal Medicine

## 2024-09-06 ENCOUNTER — Other Ambulatory Visit (HOSPITAL_COMMUNITY): Payer: Self-pay

## 2024-09-06 ENCOUNTER — Other Ambulatory Visit: Payer: Self-pay

## 2024-09-07 ENCOUNTER — Other Ambulatory Visit: Payer: Self-pay

## 2024-09-07 NOTE — Telephone Encounter (Signed)
 Hi! I scheduled both of your appointments to see Dr. Trudy on 10/21/2024 @ 9:15 and 9:45.   Thank you!

## 2024-09-08 ENCOUNTER — Other Ambulatory Visit: Payer: Self-pay

## 2024-09-08 ENCOUNTER — Other Ambulatory Visit (HOSPITAL_BASED_OUTPATIENT_CLINIC_OR_DEPARTMENT_OTHER): Payer: Self-pay

## 2024-09-09 DIAGNOSIS — H905 Unspecified sensorineural hearing loss: Secondary | ICD-10-CM | POA: Diagnosis not present

## 2024-09-13 ENCOUNTER — Other Ambulatory Visit: Payer: Self-pay

## 2024-09-14 ENCOUNTER — Encounter: Payer: Self-pay | Admitting: Internal Medicine

## 2024-09-14 ENCOUNTER — Other Ambulatory Visit: Payer: Self-pay

## 2024-09-14 ENCOUNTER — Other Ambulatory Visit: Payer: Self-pay | Admitting: Internal Medicine

## 2024-09-14 DIAGNOSIS — B002 Herpesviral gingivostomatitis and pharyngotonsillitis: Secondary | ICD-10-CM

## 2024-09-14 DIAGNOSIS — R52 Pain, unspecified: Secondary | ICD-10-CM

## 2024-09-21 ENCOUNTER — Other Ambulatory Visit (HOSPITAL_COMMUNITY): Payer: Self-pay

## 2024-09-21 ENCOUNTER — Other Ambulatory Visit: Payer: Self-pay | Admitting: Internal Medicine

## 2024-09-21 ENCOUNTER — Other Ambulatory Visit: Payer: Self-pay

## 2024-09-21 DIAGNOSIS — B002 Herpesviral gingivostomatitis and pharyngotonsillitis: Secondary | ICD-10-CM

## 2024-09-21 MED ORDER — VALACYCLOVIR HCL 1 G PO TABS
500.0000 mg | ORAL_TABLET | Freq: Every day | ORAL | 5 refills | Status: AC | PRN
  Filled 2024-09-21: qty 30, 60d supply, fill #0

## 2024-09-22 ENCOUNTER — Other Ambulatory Visit (HOSPITAL_COMMUNITY): Payer: Self-pay

## 2024-09-30 ENCOUNTER — Other Ambulatory Visit: Payer: Self-pay

## 2024-09-30 ENCOUNTER — Other Ambulatory Visit (HOSPITAL_COMMUNITY): Payer: Self-pay

## 2024-10-01 ENCOUNTER — Other Ambulatory Visit: Payer: Self-pay

## 2024-10-04 ENCOUNTER — Other Ambulatory Visit: Payer: Self-pay

## 2024-10-05 ENCOUNTER — Other Ambulatory Visit: Payer: Self-pay

## 2024-10-05 MED ORDER — FLUZONE HIGH-DOSE 0.5 ML IM SUSY
0.5000 mL | PREFILLED_SYRINGE | Freq: Once | INTRAMUSCULAR | 0 refills | Status: AC
  Filled 2024-10-05: qty 0.5, 1d supply, fill #0

## 2024-10-06 ENCOUNTER — Other Ambulatory Visit: Payer: Self-pay

## 2024-10-18 ENCOUNTER — Other Ambulatory Visit: Payer: Self-pay

## 2024-10-21 ENCOUNTER — Ambulatory Visit (INDEPENDENT_AMBULATORY_CARE_PROVIDER_SITE_OTHER): Admitting: Internal Medicine

## 2024-10-21 ENCOUNTER — Encounter: Payer: Self-pay | Admitting: Internal Medicine

## 2024-10-21 VITALS — BP 154/78 | HR 62 | Temp 98.2°F | Ht 64.0 in | Wt 145.2 lb

## 2024-10-21 DIAGNOSIS — Z79899 Other long term (current) drug therapy: Secondary | ICD-10-CM | POA: Diagnosis not present

## 2024-10-21 DIAGNOSIS — E063 Autoimmune thyroiditis: Secondary | ICD-10-CM

## 2024-10-21 DIAGNOSIS — K648 Other hemorrhoids: Secondary | ICD-10-CM

## 2024-10-21 DIAGNOSIS — M8589 Other specified disorders of bone density and structure, multiple sites: Secondary | ICD-10-CM

## 2024-10-21 DIAGNOSIS — E65 Localized adiposity: Secondary | ICD-10-CM | POA: Diagnosis not present

## 2024-10-21 DIAGNOSIS — Z9181 History of falling: Secondary | ICD-10-CM

## 2024-10-21 DIAGNOSIS — E7800 Pure hypercholesterolemia, unspecified: Secondary | ICD-10-CM

## 2024-10-21 LAB — POCT GLYCOSYLATED HEMOGLOBIN (HGB A1C): Hemoglobin A1C: 5.4 % (ref 4.0–5.6)

## 2024-10-21 LAB — GLUCOSE, CAPILLARY: Glucose-Capillary: 85 mg/dL (ref 70–99)

## 2024-10-21 NOTE — Assessment & Plan Note (Signed)
 Due for DXA in early 2026. No fractures.

## 2024-10-21 NOTE — Assessment & Plan Note (Signed)
 Due for TSH at next visit.

## 2024-10-21 NOTE — Progress Notes (Signed)
 78 y.o. Gwendolyn Perez is here for routine follow-up and she's happy to report that she's been doing well.  Since our last visit she has received very effective hearing aides and has undergone successful hemorrhoid surgery.  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 A slip/fall on loose gravel when walking downhill on a hike resulted in injuries to R arm and hand and forehead; she has healed well but scars remain.  Body weight composition changes over the years are annoying, and this very energetic dynamo is frustrated with a noticeable ebb in her get up and go. She remains more full of energy than most of the rest of us  by comparison.  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   Hypercholesterolemia 02/20/2022   Benign paroxysmal positional vertigo 02/20/2022   Osteoarthritis, localized, hand, unspecified laterality 08/22/2020   Pain of right sacroiliac joint 08/04/2014   Degenerative lumbar spinal stenosis 03/30/2014   Trochanteric bursitis of right hip 03/30/2014    Current Outpatient Medications:    acetaminophen  (TYLENOL ) 500 MG tablet, Take 2 tablets (1,000 mg total) by mouth at bedtime., Disp: 60 tablet, Rfl: 11   acetaminophen  (TYLENOL ) 500 MG tablet, Take 2 tablets (1,000 mg total) by mouth at bedtime., Disp: 180 tablet, Rfl: 3    cholecalciferol  (VITAMIN D3) 25 MCG (1000 UNIT) tablet, Take 1 tablet (25 mcg total) by mouth daily., Disp: 30 tablet, Rfl: 11   DULoxetine  (CYMBALTA ) 60 MG capsule, Take 1 capsule (60 mg total) by mouth every morning., Disp: 30 capsule, Rfl: 11   gabapentin  (NEURONTIN ) 300 MG capsule, Take 1 capsule (300 mg total) by mouth 3 (three) times daily as needed., Disp: 90 capsule, Rfl: 11   levothyroxine  (SYNTHROID ) 25 MCG tablet, Take 1 tablet (25 mcg total) by mouth daily before breakfast., Disp: 30 tablet, Rfl: 11   omeprazole  (PRILOSEC) 20 MG capsule, Take 1 capsule (20 mg total) by mouth daily., Disp: 30 capsule, Rfl: 11   valACYclovir  (VALTREX ) 1000 MG tablet, Take 0.5 tablets (500 mg total) by mouth daily as needed (herpes stomatitis)., Disp: 30 tablet, Rfl: 5  Functional Status: Very successfully independent and active  Objective BP (!) 154/78 (BP Location: Right Arm, Patient Position: Sitting, Cuff Size: Small)   Pulse 62   Temp 98.2 F (36.8 C) (Oral)   Ht 5' 4 (1.626 m)   Wt 145 lb 3.2 oz (65.9 kg)   SpO2 100%   BMI 24.92 kg/m  Exam: Well appearing, no formal exam  Assessment and Plan: Ms. Cardiff is doing well. We will get her caught up on some blood work (lipids, TSH) and her BP will need to be followed up (no hx of HTN).   Osteopenia of multiple sites Assessment & Plan: Due for DXA in early 2026. No fractures.   Internal hemorrhoids Assessment & Plan: Surgical removal summer 2025, Dr. Sheldon.   Hypercholesterolemia Assessment & Plan: Unfortunately  I neglected to address this today; she is due for lipid level and probably discussion of statin therapy.  I'll catch her next time.   Hypothyroidism due to Hashimoto thyroiditis Assessment & Plan: Due for TSH at next visit.   Central adiposity Assessment & Plan: BMI os 24.9 which is a healthy weight. Desires screening with A1C today.   Other orders -     Glucose, capillary

## 2024-10-21 NOTE — Assessment & Plan Note (Signed)
 BMI os 24.9 which is a healthy weight. Desires screening with A1C today.

## 2024-10-21 NOTE — Assessment & Plan Note (Signed)
 Surgical removal summer 2025, Dr. Sheldon.

## 2024-10-21 NOTE — Assessment & Plan Note (Signed)
>>  ASSESSMENT AND PLAN FOR HEARING IMPAIRMENT WRITTEN ON 10/21/2024  5:40 PM BY Roan Miklos ANNE, MD  New hearing aides are working very well.

## 2024-10-21 NOTE — Assessment & Plan Note (Signed)
 Unfortunately I neglected to address this today; she is due for lipid level and probably discussion of statin therapy.  I'll catch her next time.

## 2024-10-21 NOTE — Assessment & Plan Note (Signed)
 New hearing aides are working very well.

## 2024-10-22 ENCOUNTER — Other Ambulatory Visit: Payer: Self-pay | Admitting: Medical Genetics

## 2024-10-22 ENCOUNTER — Other Ambulatory Visit (HOSPITAL_COMMUNITY): Payer: Self-pay

## 2024-10-22 ENCOUNTER — Other Ambulatory Visit: Payer: Self-pay

## 2024-10-25 ENCOUNTER — Other Ambulatory Visit: Payer: Self-pay

## 2024-10-28 ENCOUNTER — Other Ambulatory Visit: Payer: Self-pay

## 2024-10-29 ENCOUNTER — Other Ambulatory Visit (HOSPITAL_COMMUNITY)
Admission: RE | Admit: 2024-10-29 | Discharge: 2024-10-29 | Disposition: A | Discharge status: Home / Self Care | Payer: Self-pay | Source: Ambulatory Visit | Attending: Medical Genetics | Admitting: Medical Genetics

## 2024-10-29 ENCOUNTER — Other Ambulatory Visit: Payer: Self-pay

## 2024-11-01 ENCOUNTER — Other Ambulatory Visit: Payer: Self-pay

## 2024-11-02 ENCOUNTER — Other Ambulatory Visit: Payer: Self-pay

## 2024-11-10 LAB — GENECONNECT MOLECULAR SCREEN: Genetic Analysis Overall Interpretation: NEGATIVE

## 2024-11-23 ENCOUNTER — Other Ambulatory Visit: Payer: Self-pay

## 2024-11-24 ENCOUNTER — Other Ambulatory Visit: Payer: Self-pay

## 2024-11-29 ENCOUNTER — Other Ambulatory Visit: Payer: Self-pay

## 2024-11-29 ENCOUNTER — Other Ambulatory Visit (HOSPITAL_COMMUNITY): Payer: Self-pay

## 2024-12-03 ENCOUNTER — Other Ambulatory Visit: Payer: Self-pay

## 2024-12-27 ENCOUNTER — Other Ambulatory Visit (HOSPITAL_COMMUNITY): Payer: Self-pay

## 2024-12-27 ENCOUNTER — Other Ambulatory Visit: Payer: Self-pay

## 2024-12-29 ENCOUNTER — Other Ambulatory Visit: Payer: Self-pay

## 2025-08-24 ENCOUNTER — Ambulatory Visit: Payer: Self-pay
